# Patient Record
Sex: Female | Born: 1980 | Race: Black or African American | Hispanic: No | Marital: Married | Smoking: Never smoker
Health system: Southern US, Community
[De-identification: ages and names within clinical notes are randomized; demographics above are authoritative.]

## PROBLEM LIST (undated history)

## (undated) ENCOUNTER — Inpatient Hospital Stay (HOSPITAL_COMMUNITY): Payer: Self-pay

## (undated) DIAGNOSIS — R110 Nausea: Secondary | ICD-10-CM

## (undated) DIAGNOSIS — Z3490 Encounter for supervision of normal pregnancy, unspecified, unspecified trimester: Secondary | ICD-10-CM

## (undated) DIAGNOSIS — Z789 Other specified health status: Secondary | ICD-10-CM

## (undated) HISTORY — DX: Nausea: R11.0

## (undated) HISTORY — DX: Other specified health status: Z78.9

## (undated) HISTORY — DX: Encounter for supervision of normal pregnancy, unspecified, unspecified trimester: Z34.90

---

## 2000-08-28 ENCOUNTER — Emergency Department (HOSPITAL_COMMUNITY): Admission: EM | Admit: 2000-08-28 | Discharge: 2000-08-28 | Payer: Self-pay | Admitting: Emergency Medicine

## 2001-05-26 ENCOUNTER — Other Ambulatory Visit: Admission: RE | Admit: 2001-05-26 | Discharge: 2001-05-26 | Payer: Self-pay | Admitting: Obstetrics and Gynecology

## 2001-08-05 ENCOUNTER — Emergency Department (HOSPITAL_COMMUNITY): Admission: EM | Admit: 2001-08-05 | Discharge: 2001-08-05 | Payer: Self-pay | Admitting: Emergency Medicine

## 2001-09-04 ENCOUNTER — Emergency Department (HOSPITAL_COMMUNITY): Admission: EM | Admit: 2001-09-04 | Discharge: 2001-09-04 | Payer: Self-pay | Admitting: *Deleted

## 2002-04-02 ENCOUNTER — Emergency Department (HOSPITAL_COMMUNITY): Admission: EM | Admit: 2002-04-02 | Discharge: 2002-04-02 | Payer: Self-pay | Admitting: Emergency Medicine

## 2002-06-06 ENCOUNTER — Emergency Department (HOSPITAL_COMMUNITY): Admission: EM | Admit: 2002-06-06 | Discharge: 2002-06-06 | Payer: Self-pay | Admitting: *Deleted

## 2003-01-03 ENCOUNTER — Emergency Department (HOSPITAL_COMMUNITY): Admission: EM | Admit: 2003-01-03 | Discharge: 2003-01-03 | Payer: Self-pay | Admitting: Emergency Medicine

## 2003-11-12 ENCOUNTER — Emergency Department (HOSPITAL_COMMUNITY): Admission: EM | Admit: 2003-11-12 | Discharge: 2003-11-12 | Payer: Self-pay | Admitting: Emergency Medicine

## 2004-08-25 ENCOUNTER — Emergency Department (HOSPITAL_COMMUNITY): Admission: EM | Admit: 2004-08-25 | Discharge: 2004-08-25 | Payer: Self-pay | Admitting: Emergency Medicine

## 2004-10-11 ENCOUNTER — Emergency Department (HOSPITAL_COMMUNITY): Admission: EM | Admit: 2004-10-11 | Discharge: 2004-10-11 | Payer: Self-pay | Admitting: Emergency Medicine

## 2005-01-19 ENCOUNTER — Emergency Department (HOSPITAL_COMMUNITY): Admission: EM | Admit: 2005-01-19 | Discharge: 2005-01-19 | Payer: Self-pay | Admitting: Emergency Medicine

## 2005-07-27 ENCOUNTER — Emergency Department (HOSPITAL_COMMUNITY): Admission: EM | Admit: 2005-07-27 | Discharge: 2005-07-27 | Payer: Self-pay | Admitting: Emergency Medicine

## 2005-11-15 ENCOUNTER — Emergency Department (HOSPITAL_COMMUNITY): Admission: EM | Admit: 2005-11-15 | Discharge: 2005-11-15 | Payer: Self-pay | Admitting: Emergency Medicine

## 2006-04-02 ENCOUNTER — Emergency Department (HOSPITAL_COMMUNITY): Admission: EM | Admit: 2006-04-02 | Discharge: 2006-04-02 | Payer: Self-pay | Admitting: Emergency Medicine

## 2006-04-11 ENCOUNTER — Emergency Department (HOSPITAL_COMMUNITY): Admission: EM | Admit: 2006-04-11 | Discharge: 2006-04-11 | Payer: Self-pay | Admitting: Emergency Medicine

## 2006-10-29 ENCOUNTER — Emergency Department (HOSPITAL_COMMUNITY): Admission: EM | Admit: 2006-10-29 | Discharge: 2006-10-29 | Payer: Self-pay | Admitting: *Deleted

## 2007-02-10 ENCOUNTER — Emergency Department (HOSPITAL_COMMUNITY): Admission: EM | Admit: 2007-02-10 | Discharge: 2007-02-10 | Payer: Self-pay | Admitting: Emergency Medicine

## 2007-03-11 ENCOUNTER — Inpatient Hospital Stay (HOSPITAL_COMMUNITY): Admission: AD | Admit: 2007-03-11 | Discharge: 2007-03-13 | Payer: Self-pay | Admitting: Obstetrics & Gynecology

## 2007-03-11 ENCOUNTER — Ambulatory Visit: Payer: Self-pay | Admitting: *Deleted

## 2007-10-06 ENCOUNTER — Emergency Department (HOSPITAL_COMMUNITY): Admission: EM | Admit: 2007-10-06 | Discharge: 2007-10-06 | Payer: Self-pay | Admitting: Emergency Medicine

## 2008-06-30 IMAGING — CR DG FOOT COMPLETE 3+V*R*
3 series · 3 of 3 positions shown · non-contrast
Comparison: none

CLINICAL DATA: 24-year-old with knot on right foot.  Anterolateral knot for several weeks.  No known injury.
 RIGHT FOOT - 3 VIEW:

[view not recorded (1 of 3)]
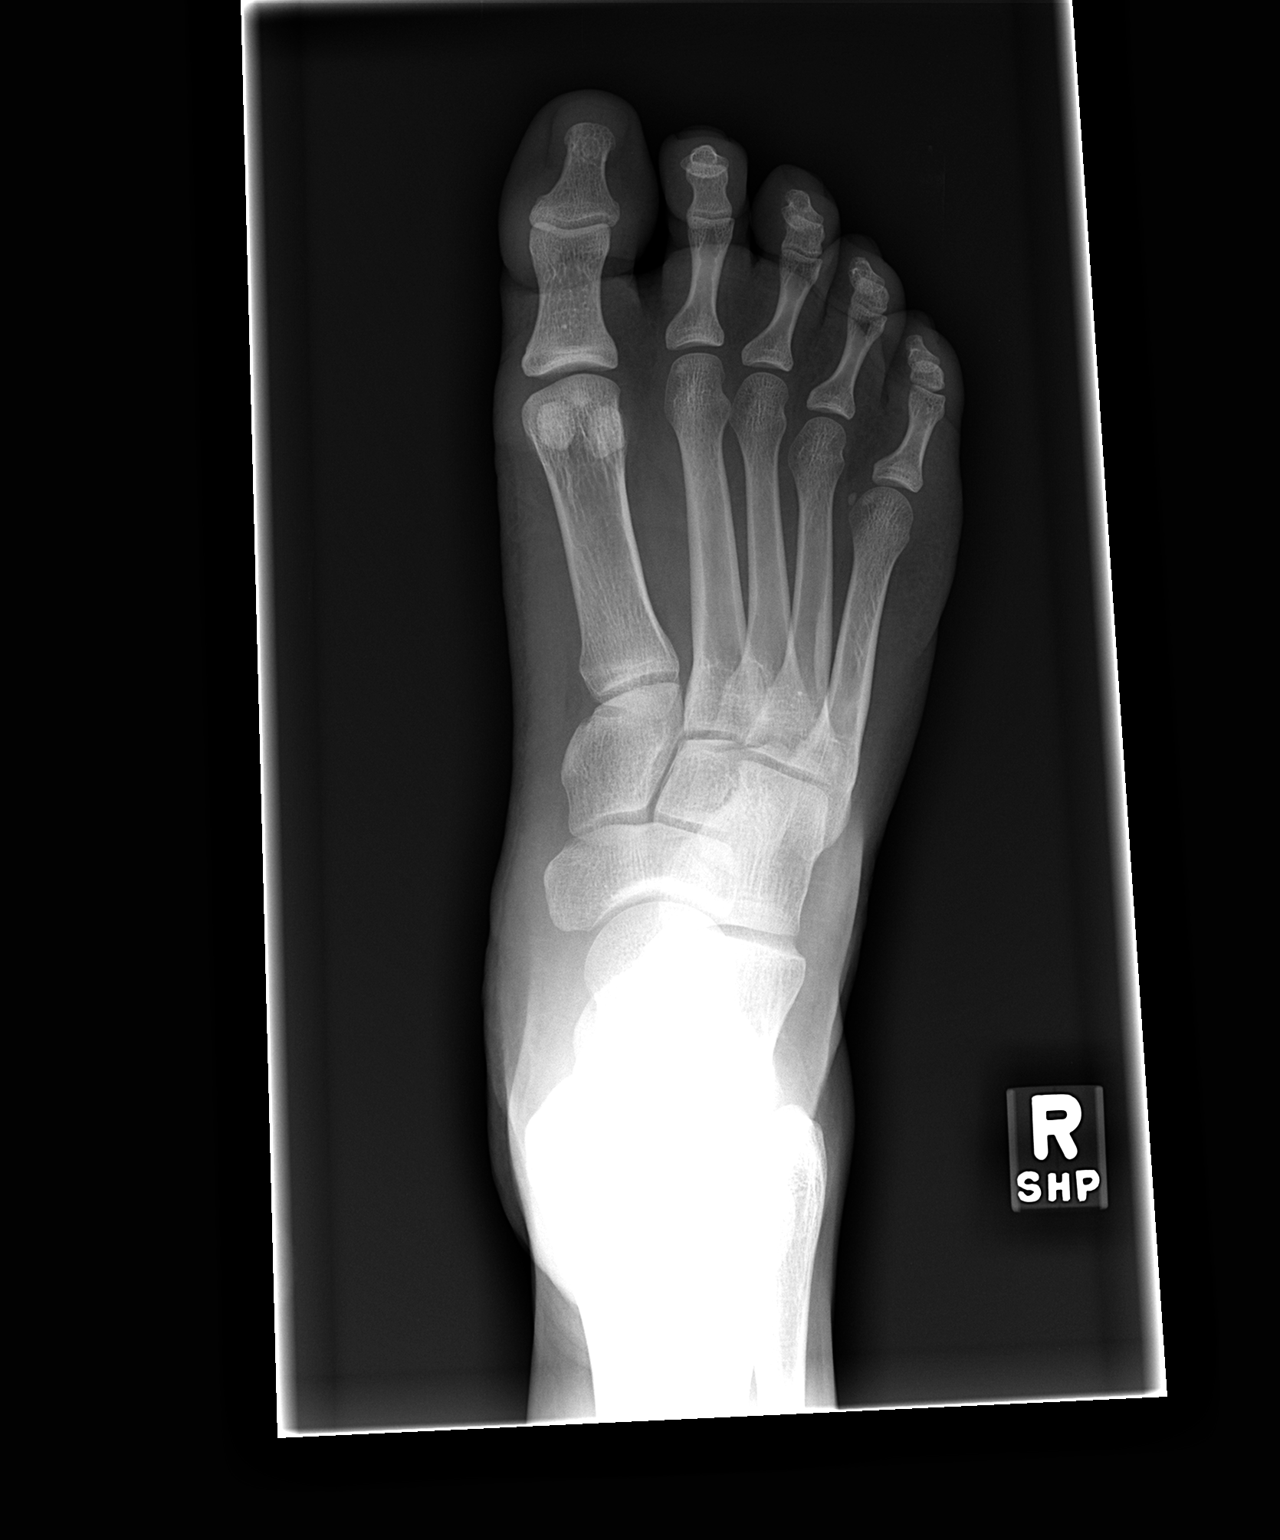

[view not recorded (2 of 3)]
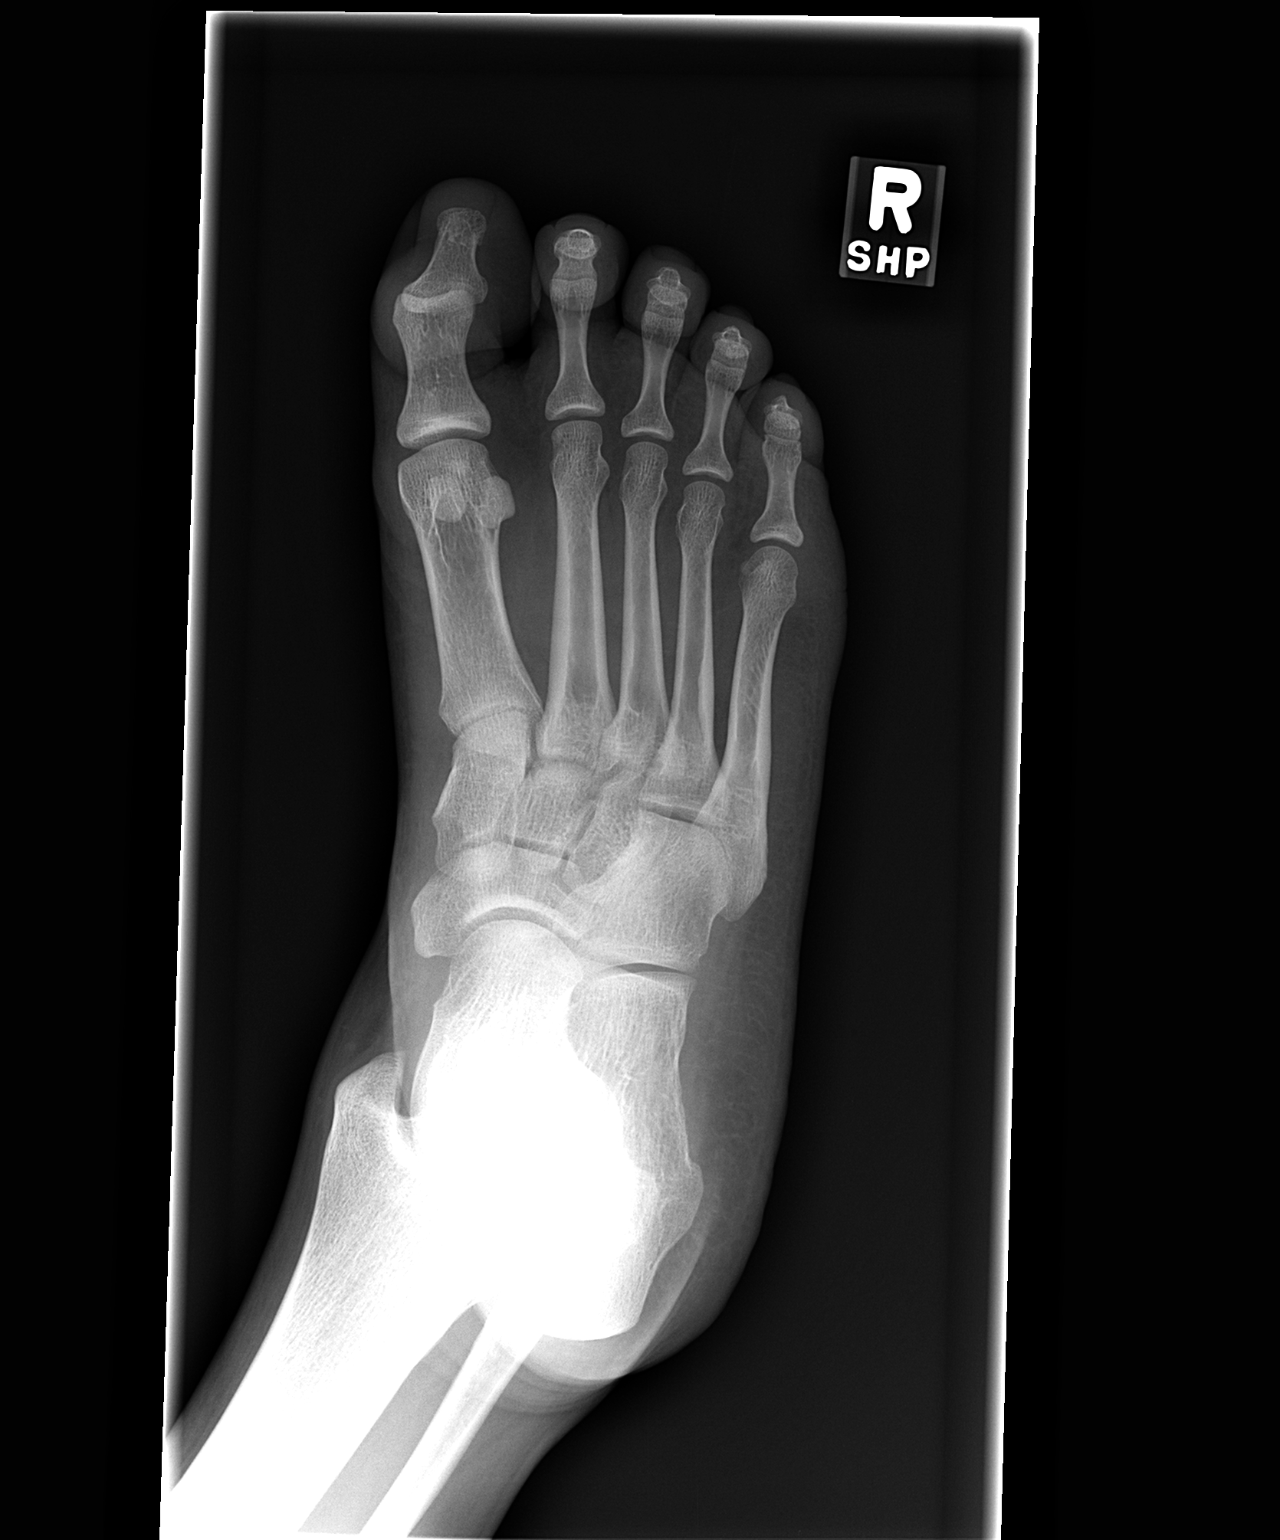

[view not recorded (3 of 3)]
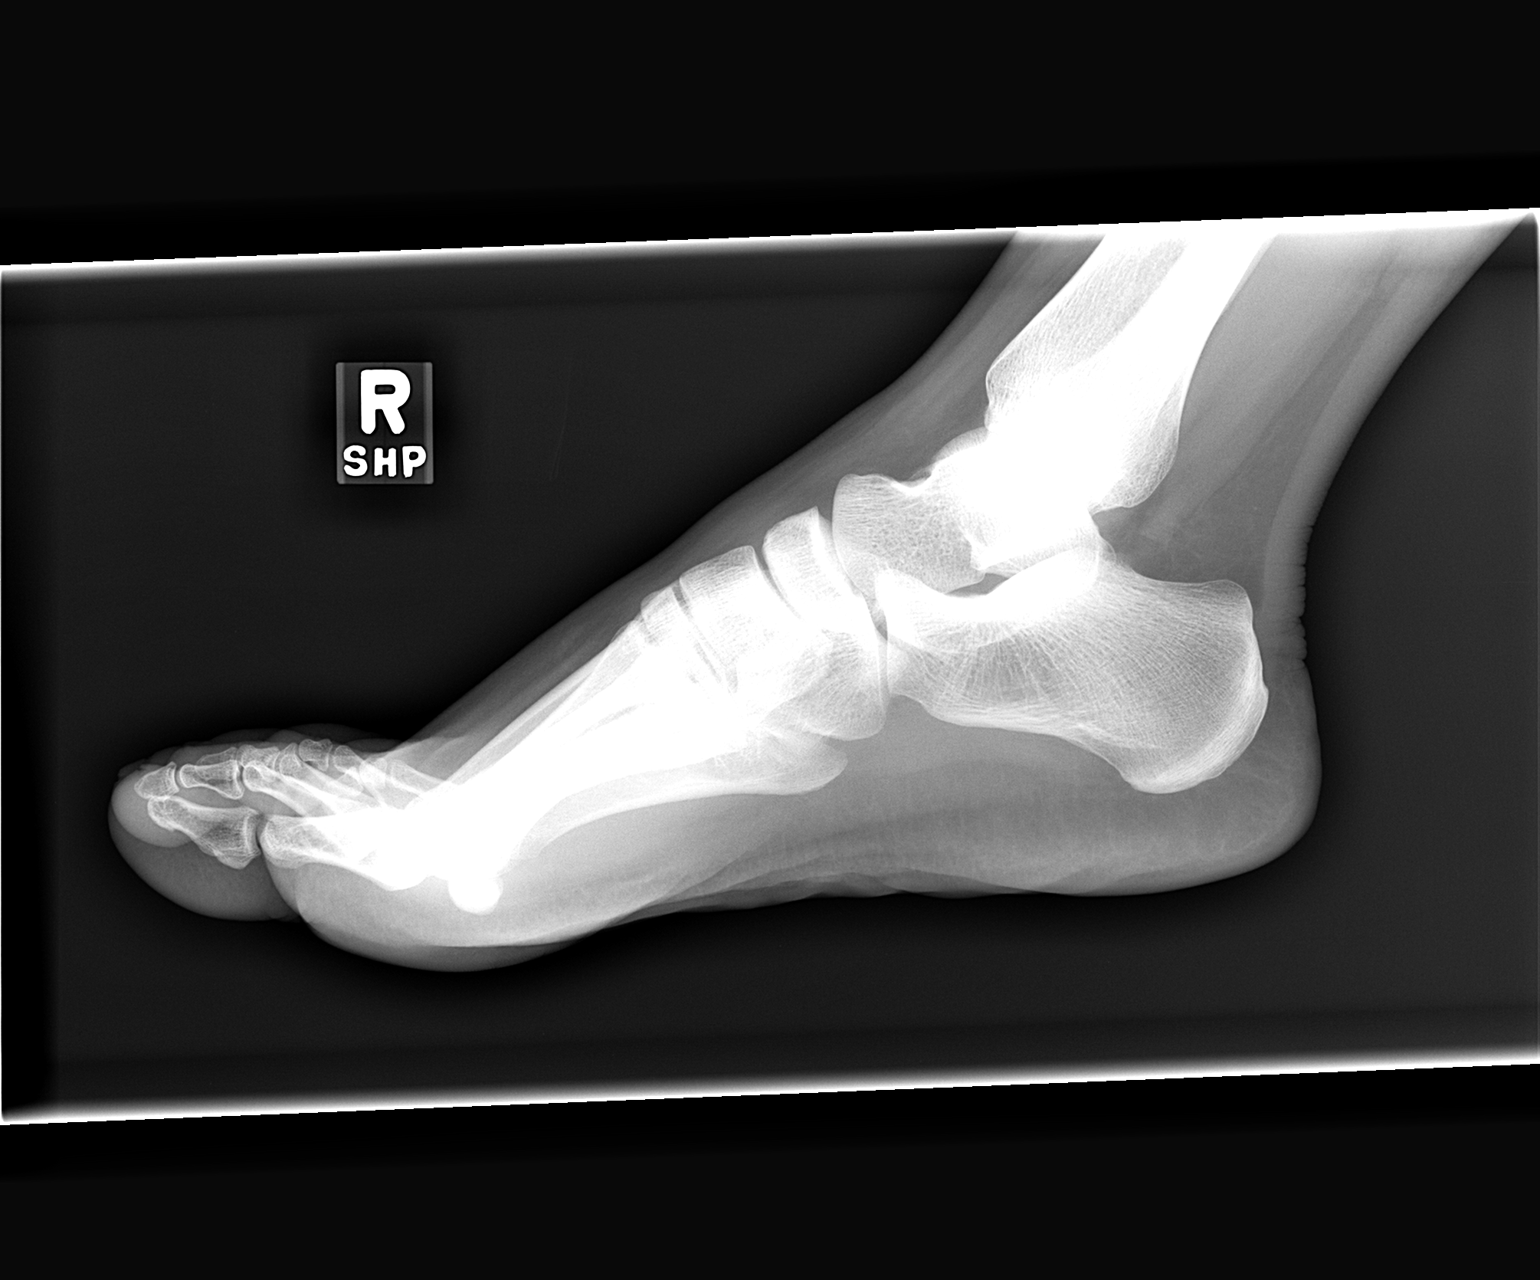

[3 of 3 positions shown; findings below may reference images not displayed]

FINDINGS: There is no evidence of fracture or dislocation.  There is no evidence of arthropathy or other focal bone abnormality.  Soft tissues are unremarkable.
IMPRESSION: Negative.

## 2009-03-28 ENCOUNTER — Emergency Department (HOSPITAL_COMMUNITY): Admission: EM | Admit: 2009-03-28 | Discharge: 2009-03-28 | Payer: Self-pay | Admitting: Emergency Medicine

## 2009-07-14 ENCOUNTER — Emergency Department (HOSPITAL_COMMUNITY): Admission: EM | Admit: 2009-07-14 | Discharge: 2009-07-14 | Payer: Self-pay | Admitting: Emergency Medicine

## 2010-03-03 ENCOUNTER — Emergency Department (HOSPITAL_COMMUNITY): Admission: EM | Admit: 2010-03-03 | Discharge: 2010-03-03 | Payer: Self-pay | Admitting: Emergency Medicine

## 2010-03-25 ENCOUNTER — Emergency Department (HOSPITAL_COMMUNITY)
Admission: EM | Admit: 2010-03-25 | Discharge: 2010-03-25 | Payer: Self-pay | Source: Home / Self Care | Admitting: Emergency Medicine

## 2010-08-07 ENCOUNTER — Emergency Department (HOSPITAL_COMMUNITY)
Admission: EM | Admit: 2010-08-07 | Discharge: 2010-08-07 | Disposition: A | Discharge status: Home / Self Care | Payer: Medicaid Other | Attending: Emergency Medicine | Admitting: Emergency Medicine

## 2010-08-07 DIAGNOSIS — H5789 Other specified disorders of eye and adnexa: Secondary | ICD-10-CM | POA: Insufficient documentation

## 2010-08-07 DIAGNOSIS — J309 Allergic rhinitis, unspecified: Secondary | ICD-10-CM | POA: Insufficient documentation

## 2010-08-07 DIAGNOSIS — J3489 Other specified disorders of nose and nasal sinuses: Secondary | ICD-10-CM | POA: Insufficient documentation

## 2011-01-07 ENCOUNTER — Emergency Department (HOSPITAL_COMMUNITY)
Admission: EM | Admit: 2011-01-07 | Discharge: 2011-01-07 | Disposition: A | Discharge status: Home / Self Care | Payer: Medicaid Other | Attending: Emergency Medicine | Admitting: Emergency Medicine

## 2011-01-07 ENCOUNTER — Emergency Department (HOSPITAL_COMMUNITY): Payer: Medicaid Other

## 2011-01-07 ENCOUNTER — Encounter: Payer: Self-pay | Admitting: Emergency Medicine

## 2011-01-07 DIAGNOSIS — J069 Acute upper respiratory infection, unspecified: Secondary | ICD-10-CM | POA: Insufficient documentation

## 2011-01-07 NOTE — ED Notes (Signed)
Pt c/o cough and chest pain with cough and sore throat x 3 days.

## 2011-01-07 NOTE — ED Provider Notes (Signed)
History     CSN: [id number] Arrival date & time: 01/07/2011  5:01 PM   Chief Complaint  Patient presents with  . Cough  . Sore Throat      HPI Pt was seen at 1720.  Tammy Perry is a 30 y.o. female who presents to the Emergency Department complaining of gradual onset and persistence of intermittent cough, runny/stuffy nose, and sore throat x3 days.  Denies fevers, no rash, no abd pain, no N/V/D, no back pain, no hoarse voice.    PAST MEDICAL HISTORY:  History reviewed. No pertinent past medical history.  PAST SURGICAL HISTORY:  History reviewed. No pertinent past surgical history.  MEDICATIONS:  Previous Medications   No medications on file     ALLERGIES:  Allergies as of 01/07/2011  . (No Known Allergies)     SOCIAL HISTORY: History   Social History  . Marital Status: Single    Spouse Name: N/A    Number of Children: N/A  . Years of Education: N/A   Social History Main Topics  . Smoking status: Never Smoker   . Smokeless tobacco: None  . Alcohol Use: No  . Drug Use: No  . Sexually Active:    Other Topics Concern  . None   Social History Narrative  . None     Review of Systems ROS: Statement: All systems negative except as marked or noted in the HPI; Constitutional: Negative for fever and chills. ; ; Eyes: Negative for eye pain, redness and discharge. ; ; ENMT: Negative for ear pain, hoarseness, sinus pressure; +runny/stuffy nose, sore throat. ; ; Cardiovascular: Negative for chest pain, palpitations, diaphoresis, dyspnea and peripheral edema. ; ; Respiratory: +cough.  Negative for wheezing and stridor. ; ; Gastrointestinal: Negative for nausea, vomiting, diarrhea and abdominal pain, blood in stool, hematemesis, jaundice and rectal bleeding. . ; ; Genitourinary: Negative for dysuria, flank pain and hematuria. ; ; Musculoskeletal: Negative for back pain and neck pain. Negative for swelling and trauma.; ; Skin: Negative for pruritus, rash, abrasions,  blisters, bruising and skin lesion.; ; Neuro: Negative for headache, lightheadedness and neck stiffness. Negative for weakness, altered level of consciousness , altered mental status, extremity weakness, paresthesias, involuntary movement, seizure and syncope.      Physical Exam    BP 139/95  Pulse 94  Temp(Src) 99.8 F (37.7 C) (Oral)  Resp 18  Ht 5\' 6"  (1.676 m)  Wt 192 lb (87.091 kg)  BMI 30.99 kg/m2  SpO2 100%  LMP 12/17/2010  Physical Exam 1725: Physical examination:  Nursing notes reviewed; Vital signs and O2 SAT reviewed;  Constitutional: Well developed, Well nourished, Well hydrated, In no acute distress; Head:  Normocephalic, atraumatic; Eyes: EOMI, PERRL, No scleral icterus; ENMT: TM's clear bilat. +edemetous nasal turbinates bilat with clear rhinorrhea. Mouth and pharynx normal, Mucous membranes moist; Neck: Supple, Full range of motion, No lymphadenopathy; Cardiovascular: Regular rate and rhythm, No murmur, rub, or gallop; Respiratory: Breath sounds clear & equal bilaterally, No rales, rhonchi, wheezes, or rub, Normal respiratory effort/excursion; Chest: Nontender, Movement normal; Abdomen: Soft, Nontender, Nondistended, Normal bowel sounds; Extremities: Pulses normal, No tenderness, No edema, No calf edema or asymmetry.; Neuro: AA&Ox3, Major CN grossly intact.  No gross focal motor or sensory deficits in extremities.; Skin: Color normal, Warm, Dry, no rash, no petechiae.    ED Course  Procedures  MDM MDM Reviewed: nursing note and vitals Interpretation: labs and x-ray   Results for orders placed during the hospital encounter of 01/07/11  RAPID STREP SCREEN      Component Value Range   Streptococcus, Group A Screen (Direct) NEGATIVE  NEGATIVE    Dg Chest 2 View 01/07/2011  *RADIOLOGY REPORT*  Clinical Data: Cough, chest pain, sore throat  CHEST - 2 VIEW  Comparison: None  Findings: Normal heart size, mediastinal contours, and pulmonary vascularity. Mild peribronchial  thickening. No pulmonary infiltrate, pleural effusion, or pneumothorax. Bones unremarkable.  IMPRESSION: Minimal bronchitic changes.  Original Report Authenticated By: Lollie Marrow, M.D.   6:37 PM:  Dx testing d/w pt.  Questions answered.  Verb understanding, agreeable to d/c home with outpt f/u.      Tammy Maret Allison Quarry, DO 01/07/11 2305

## 2011-02-02 LAB — CROSSMATCH: Antibody Screen: NEGATIVE

## 2011-02-02 LAB — CBC
HCT: 19 — ABNORMAL LOW
HCT: 29.2 — ABNORMAL LOW
HCT: 36.3
Hemoglobin: 12
Hemoglobin: 6.4 — CL
MCHC: 32.9
MCV: 71.6 — ABNORMAL LOW
Platelets: 148 — ABNORMAL LOW
Platelets: 149 — ABNORMAL LOW
Platelets: 166
RBC: 2.66 — ABNORMAL LOW
RBC: 2.84 — ABNORMAL LOW
RBC: 4.03
RBC: 5.1
RDW: 14.2
RDW: 14.4
RDW: 14.6
WBC: 9.3

## 2011-02-02 LAB — ABO/RH: ABO/RH(D): O POS

## 2011-02-03 LAB — BASIC METABOLIC PANEL
CO2: 25
Chloride: 104
GFR calc Af Amer: >60
GFR calc non Af Amer: >60
Glucose, Bld: 86
Potassium: 3.6
Sodium: 135

## 2011-02-03 LAB — CBC
HCT: 34.4 — ABNORMAL LOW
Hemoglobin: 11.1 — ABNORMAL LOW
MCHC: 32.3
MCV: 70.4 — ABNORMAL LOW
RBC: 4.89
RDW: 14

## 2011-02-03 LAB — URINALYSIS, ROUTINE W REFLEX MICROSCOPIC
Glucose, UA: NEGATIVE
Hgb urine dipstick: NEGATIVE
Ketones, ur: NEGATIVE
Protein, ur: 30 — AB
Specific Gravity, Urine: >1.03 — ABNORMAL HIGH
pH: 6

## 2011-02-03 LAB — URINE MICROSCOPIC-ADD ON

## 2011-02-09 LAB — CBC
RBC: 4.69
RDW: 14.5 — ABNORMAL HIGH

## 2011-02-09 LAB — DIFFERENTIAL
Basophils Relative: 0
Eosinophils Absolute: 0.1
Monocytes Relative: 13 — ABNORMAL HIGH
Neutro Abs: 4.5

## 2011-04-27 ENCOUNTER — Encounter (HOSPITAL_COMMUNITY): Payer: Self-pay

## 2011-04-27 ENCOUNTER — Emergency Department (HOSPITAL_COMMUNITY)
Admission: EM | Admit: 2011-04-27 | Discharge: 2011-04-27 | Disposition: A | Discharge status: Home / Self Care | Payer: Medicaid Other | Attending: Emergency Medicine | Admitting: Emergency Medicine

## 2011-04-27 DIAGNOSIS — L299 Pruritus, unspecified: Secondary | ICD-10-CM | POA: Insufficient documentation

## 2011-04-27 DIAGNOSIS — L259 Unspecified contact dermatitis, unspecified cause: Secondary | ICD-10-CM | POA: Insufficient documentation

## 2011-04-27 DIAGNOSIS — L309 Dermatitis, unspecified: Secondary | ICD-10-CM

## 2011-04-27 MED ORDER — PREDNISONE 10 MG PO TABS: ORAL_TABLET | ORAL | Status: DC

## 2011-04-27 NOTE — ED Provider Notes (Signed)
History     CSN: [id number]  Arrival date & time 04/27/11  1420   First MD Initiated Contact with Patient 04/27/11 1514      Chief Complaint  Patient presents with  . Rash    (Consider location/radiation/quality/duration/timing/severity/associated sxs/prior treatment) HPI Comments: Patient c/o persistent, itching rash to the back her of her and lower back.  She is unsure if possible exposure to new detergents, lotions or soap.  She denies fever, redness, blisters or recent illness.   Patient is a 31 y.o. female presenting with rash. The history is provided by the patient.  Rash  This is a new problem. The current episode started more than 1 week ago. The problem has not changed since onset.The problem is associated with an unknown factor. There has been no fever. The rash is present on the neck and back. The patient is experiencing no pain. Associated symptoms include itching. Pertinent negatives include no blisters, no pain and no weeping. She has tried nothing for the symptoms. The treatment provided no relief.    History reviewed. No pertinent past medical history.  History reviewed. No pertinent past surgical history.  History reviewed. No pertinent family history.  History  Substance Use Topics  . Smoking status: Never Smoker   . Smokeless tobacco: Not on file  . Alcohol Use: No    OB History    Grav Para Term Preterm Abortions TAB SAB Ect Mult Living                  Review of Systems  Constitutional: Negative for fever.  HENT: Negative for neck pain and neck stiffness.   Musculoskeletal: Negative.   Skin: Positive for itching and rash. Negative for color change and wound.  Neurological: Negative for headaches.  Hematological: Negative for adenopathy.  All other systems reviewed and are negative.    Allergies  Review of patient's allergies indicates no known allergies.  Home Medications   Current Outpatient Rx  Name Route Sig Dispense Refill  .  NORETHIN-ETH ESTRAD-FE BIPHAS 1 MG-10 MCG / 10 MCG PO TABS Oral Take 1 tablet by mouth at bedtime.      . IBUPROFEN 200 MG PO TABS Oral Take 200 mg by mouth as needed. For headache       BP 122/80  Pulse 99  Temp(Src) 98.5 F (36.9 C) (Oral)  Resp 18  Ht 5\' 6"  (1.676 m)  Wt 195 lb 3.2 oz (88.542 kg)  BMI 31.51 kg/m2  SpO2 100%  LMP 04/14/2011  Physical Exam  Nursing note and vitals reviewed. Constitutional: She is oriented to person, place, and time. She appears well-developed and well-nourished. No distress.  HENT:  Head: Normocephalic and atraumatic.  Mouth/Throat: Oropharynx is clear and moist.  Neck: Normal range of motion. Neck supple.  Cardiovascular: Normal rate, regular rhythm and normal heart sounds.   Pulmonary/Chest: Effort normal and breath sounds normal.  Musculoskeletal: She exhibits no edema and no tenderness.  Lymphadenopathy:    She has no cervical adenopathy.  Neurological: She is alert and oriented to person, place, and time.  Skin: Rash noted. No petechiae noted. Rash is maculopapular. Rash is not pustular, not vesicular and not urticarial. No erythema.       Maculopapular rash to the posterior neck and mid lower back.    ED Course  Procedures (including critical care time)  Labs Reviewed - No data to display No results found.      MDM    Maculopapular rash to  the posterior neck and mid lower back.  Will try steroids.  Non-toxic appearing.  Areas appear excoriated.          Macky Galik L. Babacar [place], [place] 04/29/11 1609

## 2011-04-27 NOTE — ED Notes (Signed)
C/o rash to back and legs x 1 week

## 2011-04-30 NOTE — ED Provider Notes (Signed)
Medical screening examination/treatment/procedure(s) were performed by non-physician practitioner and as supervising physician I was immediately available for consultation/collaboration.  Nicoletta Dress. Colon Branch, MD 04/30/11 1524

## 2011-05-14 ENCOUNTER — Emergency Department (HOSPITAL_COMMUNITY)
Admission: EM | Admit: 2011-05-14 | Discharge: 2011-05-14 | Disposition: A | Discharge status: Home / Self Care | Payer: Medicaid Other | Attending: Emergency Medicine | Admitting: Emergency Medicine

## 2011-05-14 ENCOUNTER — Encounter (HOSPITAL_COMMUNITY): Payer: Self-pay | Admitting: Emergency Medicine

## 2011-05-14 DIAGNOSIS — R21 Rash and other nonspecific skin eruption: Secondary | ICD-10-CM | POA: Insufficient documentation

## 2011-05-14 DIAGNOSIS — L309 Dermatitis, unspecified: Secondary | ICD-10-CM

## 2011-05-14 DIAGNOSIS — L2989 Other pruritus: Secondary | ICD-10-CM | POA: Insufficient documentation

## 2011-05-14 DIAGNOSIS — L298 Other pruritus: Secondary | ICD-10-CM | POA: Insufficient documentation

## 2011-05-14 DIAGNOSIS — L259 Unspecified contact dermatitis, unspecified cause: Secondary | ICD-10-CM | POA: Insufficient documentation

## 2011-05-14 MED ORDER — DEXAMETHASONE SODIUM PHOSPHATE 4 MG/ML IJ SOLN
10.0000 mg | Freq: Once | INTRAMUSCULAR | Status: AC
  Administered 2011-05-14: 10 mg via INTRAMUSCULAR
  Filled 2011-05-14: qty 3

## 2011-05-14 MED ORDER — PREDNISONE 20 MG PO TABS: ORAL_TABLET | ORAL | Status: DC

## 2011-05-14 MED ORDER — HYDROXYZINE HCL 25 MG PO TABS: 25.0000 mg | ORAL_TABLET | Freq: Four times a day (QID) | ORAL | Status: DC

## 2011-05-14 MED ORDER — HYDROXYZINE HCL 25 MG PO TABS: 25.0000 mg | ORAL_TABLET | Freq: Four times a day (QID) | ORAL | Status: AC

## 2011-05-14 NOTE — ED Provider Notes (Signed)
History     CSN: [id number]  Arrival date & time 05/14/11  0946   First MD Initiated Contact with Patient 05/14/11 1013      Chief Complaint  Patient presents with  . Rash    (Consider location/radiation/quality/duration/timing/severity/associated sxs/prior treatment) HPI Comments: Patient returns to Ed with c/o recurrent rash.  She was seen here 2 weeks ago and treated for same.  States that she was given steroids and sx's resolved, but returned after she finished the medication.  Also c/o itching.  She denies fever, recent illness or swelling.  She has not followed up with anyone since the initial ed visit.    Patient is a 31 y.o. female presenting with rash. The history is provided by the patient. No language interpreter was used.  Rash  This is a recurrent problem. The current episode started more than 1 week ago. The problem has been gradually improving. The problem is associated with an unknown factor. There has been no fever. The rash is present on the neck, back, right lower leg and left lower leg. The patient is experiencing no pain. Associated symptoms include itching. Pertinent negatives include no blisters, no pain and no weeping. She has tried steriods for the symptoms. The treatment provided moderate relief.    History reviewed. No pertinent past medical history.  History reviewed. No pertinent past surgical history.  History reviewed. No pertinent family history.  History  Substance Use Topics  . Smoking status: Never Smoker   . Smokeless tobacco: Not on file  . Alcohol Use: No    OB History    Grav Para Term Preterm Abortions TAB SAB Ect Mult Living                  Review of Systems  Constitutional: Negative for fever and chills.  HENT: Negative for facial swelling and neck pain.   Musculoskeletal: Negative for back pain, joint swelling and arthralgias.  Skin: Positive for itching and rash. Negative for color change and wound.  Neurological: Negative for  dizziness, weakness and headaches.  Hematological: Negative for adenopathy.  All other systems reviewed and are negative.    Allergies  Review of patient's allergies indicates no known allergies.  Home Medications   Current Outpatient Rx  Name Route Sig Dispense Refill  . IBUPROFEN 200 MG PO TABS Oral Take 200 mg by mouth as needed. For headache     . NORETHIN-ETH ESTRAD-FE BIPHAS 1 MG-10 MCG / 10 MCG PO TABS Oral Take 1 tablet by mouth at bedtime.      Marland Kitchen PREDNISONE 10 MG PO TABS  Take 6 tablets day one, 5 tablets day two, 4 tablets day three, 3 tablets day four, 2 tablets day five, then 1 tablet day six 21 tablet 0    BP 121/81  Pulse 78  Temp(Src) 98.8 F (37.1 C) (Oral)  Resp 14  Ht 5\' 6"  (1.676 m)  Wt 165 lb (74.844 kg)  BMI 26.63 kg/m2  SpO2 98%  LMP 05/07/2011  Physical Exam  Nursing note and vitals reviewed. Constitutional: She is oriented to person, place, and time. She appears well-developed and well-nourished. No distress.  HENT:  Head: Normocephalic and atraumatic.  Eyes: EOM are normal. Pupils are equal, round, and reactive to light.  Neck: Normal range of motion. Neck supple. No thyromegaly present.  Cardiovascular: Normal rate, regular rhythm and normal heart sounds.   No murmur heard. Pulmonary/Chest: Effort normal and breath sounds normal.  Musculoskeletal: She exhibits no edema  and no tenderness.  Lymphadenopathy:    She has no cervical adenopathy.  Neurological: She is alert and oriented to person, place, and time.  Skin: Skin is warm and dry. Rash noted. No petechiae noted. Rash is maculopapular. Rash is not pustular, not vesicular and not urticarial. No erythema.       Papular rash to the posterior neck ,  Lower back and lower extremities.      ED Course  Procedures (including critical care time)      MDM     Recurrent rash to the lower back, posterior neck and lower extremities.  Will repeat a course of steroids.  She agrees to f/u with  dermatology.  Non-toxic appearing.  Medical screening examination/treatment/procedure(s) were performed by non-physician practitioner and as supervising physician I was immediately available for consultation/collaboration. Osvaldo Human, M.D.       Tammy L. [place], [place] 05/16/11 2307  Carleene Cooper III, MD 05/19/11 [phone]

## 2011-05-14 NOTE — ED Notes (Signed)
Pt to ED with c/o rash on neck and BLE. Stated she was seen in ED on 04/27/11 for same complaints and given Prednisone. Rash did clear up with the medication but the itching started again 1 week ago.

## 2011-05-28 ENCOUNTER — Emergency Department (HOSPITAL_COMMUNITY)
Admission: EM | Admit: 2011-05-28 | Discharge: 2011-05-28 | Disposition: A | Discharge status: Home / Self Care | Payer: Medicaid Other | Attending: Emergency Medicine | Admitting: Emergency Medicine

## 2011-05-28 ENCOUNTER — Encounter (HOSPITAL_COMMUNITY): Payer: Self-pay

## 2011-05-28 DIAGNOSIS — L309 Dermatitis, unspecified: Secondary | ICD-10-CM

## 2011-05-28 DIAGNOSIS — L988 Other specified disorders of the skin and subcutaneous tissue: Secondary | ICD-10-CM | POA: Insufficient documentation

## 2011-05-28 DIAGNOSIS — L851 Acquired keratosis [keratoderma] palmaris et plantaris: Secondary | ICD-10-CM | POA: Insufficient documentation

## 2011-05-28 MED ORDER — MOMETASONE FUROATE 0.1 % EX CREA: TOPICAL_CREAM | CUTANEOUS | Status: DC

## 2011-05-28 MED ORDER — HYDROXYZINE HCL 25 MG PO TABS
25.0000 mg | ORAL_TABLET | Freq: Once | ORAL | Status: AC
  Administered 2011-05-28: 25 mg via ORAL
  Filled 2011-05-28: qty 1

## 2011-05-28 MED ORDER — HYDROXYZINE HCL 25 MG PO TABS
ORAL_TABLET | ORAL | Status: AC
  Filled 2011-05-28: qty 1

## 2011-05-28 NOTE — ED Provider Notes (Signed)
History     CSN: [id number]  Arrival date & time 05/28/11  1506   First MD Initiated Contact with Patient 05/28/11 1530      Chief Complaint  Patient presents with  . Rash    (Consider location/radiation/quality/duration/timing/severity/associated sxs/prior treatment) HPI Comments: Patient comes to the emergency department complaining of a rash for one week to her upper buttocks and neck.  This is the patient's third visit to the emergency department with this complaint. She was treated on previous visits with steroids which improves the rash, but the rash returns when the medication is finished. She also complains of itching today.  She denies use of new medications, lotions, or soaps.  She was referred to a dermatologist on a previous visit but states she was unable to see the dermatologist because she was told they do not accept Medicaid patients.  She states she has an appointment with the health department in 2 weeks.  She denies redness, drainage, or swelling.  Patient is a 31 y.o. female presenting with rash. The history is provided by the patient. No language interpreter was used.  Rash  This is a chronic problem. The current episode started more than 1 week ago. Progression since onset: Waxing and waning. The problem is associated with an unknown factor. There has been no fever. The rash is present on the neck (buttocks). The patient is experiencing no pain. Associated symptoms include itching. Pertinent negatives include no blisters, no pain and no weeping. She has tried steriods for the symptoms. The treatment provided moderate relief.    History reviewed. No pertinent past medical history.  History reviewed. No pertinent past surgical history.  History reviewed. No pertinent family history.  History  Substance Use Topics  . Smoking status: Never Smoker   . Smokeless tobacco: Not on file  . Alcohol Use: No    OB History    Grav Para Term Preterm Abortions TAB SAB Ect  Mult Living                  Review of Systems  Constitutional: Negative for fever, activity change and appetite change.  HENT: Negative for facial swelling, neck pain and neck stiffness.   Skin: Positive for itching and rash. Negative for color change and pallor.  Neurological: Negative for dizziness, weakness and numbness.  All other systems reviewed and are negative.    Allergies  Review of patient's allergies indicates no known allergies.  Home Medications   Current Outpatient Rx  Name Route Sig Dispense Refill  . HYDROCORTISONE 20 MG PO TABS Oral Take 20 mg by mouth daily.    . IBUPROFEN 200 MG PO TABS Oral Take 200 mg by mouth as needed. For headache     . NORETHIN-ETH ESTRAD-FE BIPHAS 1 MG-10 MCG / 10 MCG PO TABS Oral Take 1 tablet by mouth at bedtime.        BP 141/82  Pulse 97  Temp(Src) 98.4 F (36.9 C) (Oral)  Resp 20  Ht 5\' 6"  (1.676 m)  Wt 196 lb 1 oz (88.933 kg)  BMI 31.65 kg/m2  SpO2 100%  LMP 05/07/2011  Physical Exam  Nursing note and vitals reviewed. Constitutional: She is oriented to person, place, and time. She appears well-developed and well-nourished. No distress.  HENT:  Head: Normocephalic and atraumatic.  Neck: Normal range of motion. Neck supple.  Cardiovascular: Normal rate, regular rhythm and normal heart sounds.   Pulmonary/Chest: Effort normal and breath sounds normal. No respiratory distress.  Musculoskeletal: Normal range of motion. She exhibits no edema and no tenderness.  Lymphadenopathy:    She has no cervical adenopathy.  Neurological: She is alert and oriented to person, place, and time. She exhibits normal muscle tone. Coordination normal.  Skin: Skin is warm and dry. Rash noted.       Hyperkeratotic appearing lesion to posterior right neck and upper gluteal folds.  Several small fissures of the skin and areas of excoriation.  No bleeding or drainage.    ED Course  Procedures (including critical care time)       MDM     Patient is alert and oriented. Is nontoxic appearing. Is a small hyperkeratotic area to the posterior neck and upper portion of the gluteal fold.  No drainage, bleeding or surrounding erythema.  Patient also seen by EDP.  Likely eczema.  Pt has appt in 2 weeks at the health dept.    Patient / Family / Caregiver understand and agree with initial ED impression and plan with expectations set for ED visit. Pt stable in ED with no significant deterioration in condition.         Birgitta Uhlir L. [place], [place] 05/28/11 1733

## 2011-05-28 NOTE — ED Notes (Signed)
C/o rash for 1 week. Seen recently for same given oral steroids and rash cleared. Rash back after medication completed.

## 2011-05-28 NOTE — ED Notes (Signed)
Pt a/ox4. Resp even and unlabored. NAD at this time. D/C instructions reviewed with pt. Pt verbalized understanding. Pt ambulated to lobby with steady gate.  

## 2011-05-28 NOTE — Progress Notes (Signed)
Examined patient with mid level. Patient with hyperkeratotic lesion to the right side of her posterior neck and a large similar lesion to the gluteal fold and sacrum c/w eczema with excoriation. Recommended use of Elecon  In addition to an emmollient cream (Eucerin, Aveeno, [facility] brand) and use of Metholatum for excoriation.

## 2011-05-28 NOTE — ED Provider Notes (Signed)
See progress note for my assessment of patient.    Medical screening examination/treatment/procedure(s) were conducted as a shared visit with non-physician practitioner(s) and myself.  I personally evaluated the patient during the encounter  Nicoletta Dress. Colon Branch, MD 05/28/11 1736

## 2012-10-03 ENCOUNTER — Emergency Department (HOSPITAL_COMMUNITY)
Admission: EM | Admit: 2012-10-03 | Discharge: 2012-10-03 | Disposition: A | Discharge status: Home / Self Care | Payer: Medicaid Other | Attending: Emergency Medicine | Admitting: Emergency Medicine

## 2012-10-03 ENCOUNTER — Encounter (HOSPITAL_COMMUNITY): Payer: Self-pay

## 2012-10-03 DIAGNOSIS — IMO0002 Reserved for concepts with insufficient information to code with codable children: Secondary | ICD-10-CM | POA: Insufficient documentation

## 2012-10-03 DIAGNOSIS — M7989 Other specified soft tissue disorders: Secondary | ICD-10-CM | POA: Insufficient documentation

## 2012-10-03 DIAGNOSIS — Z79899 Other long term (current) drug therapy: Secondary | ICD-10-CM | POA: Insufficient documentation

## 2012-10-03 DIAGNOSIS — Y929 Unspecified place or not applicable: Secondary | ICD-10-CM | POA: Insufficient documentation

## 2012-10-03 DIAGNOSIS — L089 Local infection of the skin and subcutaneous tissue, unspecified: Secondary | ICD-10-CM | POA: Insufficient documentation

## 2012-10-03 DIAGNOSIS — Y9389 Activity, other specified: Secondary | ICD-10-CM | POA: Insufficient documentation

## 2012-10-03 DIAGNOSIS — X58XXXA Exposure to other specified factors, initial encounter: Secondary | ICD-10-CM | POA: Insufficient documentation

## 2012-10-03 MED ORDER — MUPIROCIN CALCIUM 2 % EX CREA: TOPICAL_CREAM | Freq: Three times a day (TID) | CUTANEOUS | Status: DC

## 2012-10-03 NOTE — ED Provider Notes (Signed)
History     CSN: [id number]  Arrival date & time 10/03/12  2055   First MD Initiated Contact with Patient 10/03/12 2211      Chief Complaint  Patient presents with  . Insect Bite  . Skin Discoloration    (Consider location/radiation/quality/duration/timing/severity/associated sxs/prior treatment) HPI Comments: Pt sustained an insect bite to the right 3rd toes 3 days ago. She scratched the area and now has a discolored area of the 3rd toe. She c/o only minimal pain. Some swelling noted. Minimal drainage present. She has cleansed the area with soap and water. No other treatment reported. Not a smoker. No hx of diabetes.  No immune compromising conditions reported.  The history is provided by the patient.    History reviewed. No pertinent past medical history.  No past surgical history on file.  No family history on file.  History  Substance Use Topics  . Smoking status: Never Smoker   . Smokeless tobacco: Not on file  . Alcohol Use: No    OB History   Grav Para Term Preterm Abortions TAB SAB Ect Mult Living                  Review of Systems  Constitutional: Negative for activity change.       All ROS Neg except as noted in HPI  HENT: Negative for nosebleeds and neck pain.   Eyes: Negative for photophobia and discharge.  Respiratory: Negative for cough, shortness of breath and wheezing.   Cardiovascular: Negative for chest pain and palpitations.  Gastrointestinal: Negative for abdominal pain and blood in stool.  Genitourinary: Negative for dysuria, frequency and hematuria.  Musculoskeletal: Negative for back pain and arthralgias.  Skin: Negative.   Neurological: Negative for dizziness, seizures and speech difficulty.  Psychiatric/Behavioral: Negative for hallucinations and confusion.    Allergies  Review of patient's allergies indicates no known allergies.  Home Medications   Current Outpatient Rx  Name  Route  Sig  Dispense  Refill  . hydrocortisone  (CORTEF) 20 MG tablet   Oral   Take 20 mg by mouth daily.         Marland Kitchen ibuprofen (ADVIL,MOTRIN) 200 MG tablet   Oral   Take 200 mg by mouth as needed. For headache          . mometasone (ELOCON) 0.1 % cream      Apply to the affected areas BID   45 g   0   . Norethindrone-Ethinyl Estradiol-Fe Biphas (LO LOESTRIN FE) 1 MG-10 MCG / 10 MCG tablet   Oral   Take 1 tablet by mouth at bedtime.             BP 131/89  Pulse 88  Temp(Src) 98.2 F (36.8 C) (Oral)  Resp 18  Ht 5\' 6"  (1.676 m)  Wt 185 lb (83.915 kg)  BMI 29.87 kg/m2  SpO2 100%  LMP 09/23/2012  Physical Exam  Nursing note and vitals reviewed. Constitutional: She is oriented to person, place, and time. She appears well-developed and well-nourished.  Non-toxic appearance.  HENT:  Head: Normocephalic.  Right Ear: Tympanic membrane and external ear normal.  Left Ear: Tympanic membrane and external ear normal.  Eyes: EOM and lids are normal. Pupils are equal, round, and reactive to light.  Neck: Normal range of motion. Neck supple. Carotid bruit is not present.  Cardiovascular: Normal rate, regular rhythm, normal heart sounds, intact distal pulses and normal pulses.   Pulmonary/Chest: Breath sounds normal. No respiratory distress.  Abdominal: Soft. Bowel sounds are normal. There is no tenderness. There is no guarding.  Musculoskeletal: Normal range of motion.       Feet:  Lymphadenopathy:       Head (right side): No submandibular adenopathy present.       Head (left side): No submandibular adenopathy present.    She has no cervical adenopathy.  Neurological: She is alert and oriented to person, place, and time. She has normal strength. No cranial nerve deficit or sensory deficit.  Skin: Skin is warm and dry.  Psychiatric: She has a normal mood and affect. Her speech is normal.    ED Course  Procedures (including critical care time)  Labs Reviewed - No data to display No results found.   No diagnosis  found.    MDM  I have reviewed nursing notes, vital signs, and all appropriate lab and imaging results for this patient. Pt has a dark scabbed area of the right 3rd toe. No red streaking noted. No lesions between the toes. Plan continue to cleanse the wound with soap and water. Use bactroban tid. Pt to see her primary MD or return to the Emergency Dept if not improving.       Kathie Dike, PA-C 10/06/12 [phone]

## 2012-10-03 NOTE — ED Notes (Signed)
Pt has discoloration to rt 3rd toe. Says it started as an insect bite that itched. Now is dark and color  And cont to itch.

## 2012-10-03 NOTE — ED Notes (Signed)
I had an insect bite on my right 3rd toe and it itches a lot and now the skin is discolored.

## 2012-10-10 NOTE — ED Provider Notes (Signed)
Medical screening examination/treatment/procedure(s) were performed by non-physician practitioner and as supervising physician I was immediately available for consultation/collaboration.  Chanika Byland, MD 10/10/12 0832 

## 2012-10-19 ENCOUNTER — Encounter (HOSPITAL_COMMUNITY): Payer: Self-pay | Admitting: *Deleted

## 2012-10-19 ENCOUNTER — Emergency Department (HOSPITAL_COMMUNITY)
Admission: EM | Admit: 2012-10-19 | Discharge: 2012-10-19 | Disposition: A | Discharge status: Home / Self Care | Payer: Medicaid Other | Attending: Emergency Medicine | Admitting: Emergency Medicine

## 2012-10-19 DIAGNOSIS — L255 Unspecified contact dermatitis due to plants, except food: Secondary | ICD-10-CM | POA: Insufficient documentation

## 2012-10-19 DIAGNOSIS — B353 Tinea pedis: Secondary | ICD-10-CM | POA: Insufficient documentation

## 2012-10-19 MED ORDER — CLOTRIMAZOLE 1 % EX CREA: TOPICAL_CREAM | CUTANEOUS | Status: DC

## 2012-10-19 MED ORDER — PREDNISONE 10 MG PO TABS: ORAL_TABLET | ORAL | Status: DC

## 2012-10-19 MED ORDER — HYDROXYZINE HCL 25 MG PO TABS: 25.0000 mg | ORAL_TABLET | Freq: Four times a day (QID) | ORAL | Status: DC | PRN

## 2012-10-19 NOTE — ED Notes (Signed)
Rash to right foot x 2 months - cream rx given has not been working.  Rash to bil arms x 1 1/2 wks with itching.  Denies pain.

## 2012-10-19 NOTE — ED Provider Notes (Signed)
History    CSN: [id number] Arrival date & time 10/19/12  1459  First MD Initiated Contact with Patient 10/19/12 1604     Chief Complaint  Patient presents with  . Rash   (Consider location/radiation/quality/duration/timing/severity/associated sxs/prior Treatment) HPI Comments: Tammy Perry is a 32 y.o. female who presents to the Emergency Department complaining of persistent rash to her right third toe for 3 weeks.  She states she was seen here previously and prescribed Bactroban cream that she uses 3 times a day and the rash is not improving.  She also reports having a similar area on her right hand as well.  She report scaling and itching to the area.  She also reports a rash to her bilateral arms and upper back for one week.  States this rash is new and does not think its related to the rash on her foot.  She denies hx of diabetes, fever, chills, drainage or swelling.    The history is provided by the patient.   History reviewed. No pertinent past medical history. History reviewed. No pertinent past surgical history. No family history on file. History  Substance Use Topics  . Smoking status: Never Smoker   . Smokeless tobacco: Not on file  . Alcohol Use: No   OB History   Grav Para Term Preterm Abortions TAB SAB Ect Mult Living                 Review of Systems  Constitutional: Negative for fever, chills, activity change and appetite change.  HENT: Negative for sore throat, facial swelling, trouble swallowing, neck pain and neck stiffness.   Respiratory: Negative for chest tightness, shortness of breath and wheezing.   Skin: Positive for rash. Negative for wound.  Neurological: Negative for dizziness, weakness, numbness and headaches.  All other systems reviewed and are negative.    Allergies  Review of patient's allergies indicates no known allergies.  Home Medications   Current Outpatient Rx  Name  Route  Sig  Dispense  Refill  . mupirocin ointment  (BACTROBAN) 2 %   Topical   Apply 1 application topically 3 (three) times daily.          BP 136/86  Pulse 78  Temp(Src) 97.8 F (36.6 C) (Oral)  Resp 16  Ht 5\' 6"  (1.676 m)  Wt 185 lb (83.915 kg)  BMI 29.87 kg/m2  SpO2 100%  LMP 09/23/2012 Physical Exam  Nursing note and vitals reviewed. Constitutional: She is oriented to person, place, and time. She appears well-developed and well-nourished. No distress.  HENT:  Head: Normocephalic and atraumatic.  Mouth/Throat: Oropharynx is clear and moist.  Neck: Normal range of motion. Neck supple.  Cardiovascular: Normal rate, regular rhythm and normal heart sounds.   Pulmonary/Chest: Effort normal and breath sounds normal.  Musculoskeletal: She exhibits no edema and no tenderness.  Lymphadenopathy:    She has no cervical adenopathy.  Neurological: She is alert and oriented to person, place, and time. She exhibits normal muscle tone. Coordination normal.  Skin: Skin is warm and dry. Rash noted.  hyperkeratotic , scaly plaque to the dorsal left third toe and over the fourth MC head.  Scattered pin point erythematous papules to the bilateral forearms that appear in a linear pattern.  No vesicles or petechia.    ED Course  Procedures (including critical care time) Labs Reviewed - No data to display   MDM   hyperkeratotic , scaly plaque to the dorsal left third toe.  No  drainage, erythema or edema. Similar appearing lesion also present on the dorsal right hand.  Appears c/w tinea. Rash to UE's appears c/w plant dermatitis.   I will have patient d/c the Bactroban and prescribe clotrimazole, prednisone taper, and give referral for dermatology.  VSS.  Appears stable for discharge.  Rosali Augello L. Trisha Mangle, PA-C 10/19/12 2307

## 2012-10-19 NOTE — Discharge Instructions (Signed)
Fungus Infection of the Skin An infection of your skin caused by a fungus is a very common problem. Treatment depends on which part of the body is affected. Types of fungal skin infection include:  Athlete's Foot(Tinea pedis). This infection starts between the toes and may involve the entire sole and sides of foot. It is the most common fungal disease. It is made worse by heat, moisture, and friction. To treat, wash your feet 2 to 3 times daily. Dry thoroughly between the toes. Use medicated foot powder or cream as directed on the package. Plain talc, cornstarch, or rice powder may be dusted into socks and shoes to keep the feet dry. Wearing footwear that allows ventilation is also helpful.  Ringworm (Tinea corporis and tinea capitis). This infection causes scaly red rings to form on the skin or scalp. For skin sores, apply medicated lotion or cream as directed on the package. For the scalp, medicated shampoo may be used with with other therapies. Ringworm of the scalp or fingernails usually requires using oral medicine for 2 to 4 months.  Tinea versicolor. This infection appears as painless, scaly, patchy areas of discolored skin (whitish to light brown). It is more common in the summer and favors oily areas of the skin such as those found at the chest, abdomen, back, pubis, neck, and body folds. It can be treated with medicated shampoo or with medicated topical cream. Oral antifungals may be needed for more active infections. The light and/or dark spots may take time to get better and is not a sign of treatment failure. Fungal infections may need to be treated for several weeks to be cured. It is important not to treat fungal infections with steroids or combination medicine that contains an antifungal and steroid as these will make the fungal infection worse. SEEK MEDICAL CARE IF:   You have persistent itching or rawness.  You have an oral temperature above 102 F (38.9 C). Document Released:  05/20/2004 Document Revised: 07/05/2011 Document Reviewed: 08/05/2009 [facility] Patient Information 2013 [place], [place].  Contact Dermatitis Contact dermatitis is a rash that happens when something touches the skin. You touched something that irritates your skin, or you have allergies to something you touched. HOME CARE   Avoid the thing that caused your rash.  Keep your rash away from hot water, soap, sunlight, chemicals, and other things that might bother it.  Do not scratch your rash.  You can take cool baths to help stop itching.  Only take medicine as told by your doctor.  Keep all doctor visits as told. GET HELP RIGHT AWAY IF:   Your rash is not better after 3 days.  Your rash gets worse.  Your rash is puffy (swollen), tender, red, sore, or warm.  You have problems with your medicine. MAKE SURE YOU:   Understand these instructions.  Will watch your condition.  Will get help right away if you are not doing well or get worse. Document Released: 02/07/2009 Document Revised: 07/05/2011 Document Reviewed: 09/15/2010 [facility] Patient Information 2014 [place], [place].

## 2012-10-19 NOTE — ED Provider Notes (Signed)
Medical screening examination/treatment/procedure(s) were performed by non-physician practitioner and as supervising physician I was immediately available for consultation/collaboration. Devoria Albe, MD, Armando Gang   Ward Givens, MD 10/19/12 [phone]

## 2012-10-19 NOTE — ED Notes (Signed)
Black area to toe, seen here for same recently and no better.  Now also has rash on her arms that itch and has small blisters. Alert NAD

## 2012-12-05 ENCOUNTER — Emergency Department (HOSPITAL_COMMUNITY)
Admission: EM | Admit: 2012-12-05 | Discharge: 2012-12-05 | Disposition: A | Discharge status: Home / Self Care | Payer: Medicaid Other | Attending: Emergency Medicine | Admitting: Emergency Medicine

## 2012-12-05 ENCOUNTER — Encounter (HOSPITAL_COMMUNITY): Payer: Self-pay | Admitting: *Deleted

## 2012-12-05 DIAGNOSIS — L408 Other psoriasis: Secondary | ICD-10-CM | POA: Insufficient documentation

## 2012-12-05 DIAGNOSIS — L409 Psoriasis, unspecified: Secondary | ICD-10-CM

## 2012-12-05 MED ORDER — PREDNISONE (PAK) 10 MG PO TABS: 10.0000 mg | ORAL_TABLET | Freq: Every day | ORAL | Status: DC

## 2012-12-05 MED ORDER — TRIAMCINOLONE ACETONIDE 0.1 % EX CREA: TOPICAL_CREAM | Freq: Two times a day (BID) | CUTANEOUS | Status: DC

## 2012-12-05 NOTE — ED Provider Notes (Signed)
CSN: [id number]     Arrival date & time 12/05/12  1440 History     First MD Initiated Contact with Patient 12/05/12 1553     Chief Complaint  Patient presents with  . Rash   (Consider location/radiation/quality/duration/timing/severity/associated sxs/prior Treatment) HPI  Tammy Perry is a 32 y.o. female who presents to the ED with a rash that is located on her elbows and knees, she states she gets the same rash in the same place every year and sometimes more than once a year. This rash has been there for about a week. She denies any other problems.   History reviewed. No pertinent past medical history. History reviewed. No pertinent past surgical history. History reviewed. No pertinent family history. History  Substance Use Topics  . Smoking status: Never Smoker   . Smokeless tobacco: Not on file  . Alcohol Use: No   OB History   Grav Para Term Preterm Abortions TAB SAB Ect Mult Living                 Review of Systems  Constitutional: Negative for fever and chills.  HENT: Negative for neck pain.   Gastrointestinal: Negative for nausea and vomiting.  Skin: Positive for rash.  Neurological: Negative for headaches.  Psychiatric/Behavioral: The patient is not nervous/anxious.     Allergies  Review of patient's allergies indicates no known allergies.  Home Medications   Current Outpatient Rx  Name  Route  Sig  Dispense  Refill  . hydrOXYzine (ATARAX/VISTARIL) 25 MG tablet   Oral   Take 1 tablet (25 mg total) by mouth every 6 (six) hours as needed for itching.   20 tablet   0    BP 121/90  Pulse 81  Temp(Src) 99.1 F (37.3 C) (Oral)  Resp 18  Ht 5\' 6"  (1.676 m)  Wt 180 lb (81.647 kg)  BMI 29.07 kg/m2  SpO2 100%  LMP 11/12/2012 Physical Exam  Nursing note and vitals reviewed. Constitutional: She is oriented to person, place, and time. She appears well-developed and well-nourished. No distress.  HENT:  Head: Normocephalic.  Eyes: EOM are normal.   Neck: Neck supple.  Cardiovascular: Normal rate.   Pulmonary/Chest: Effort normal.  Abdominal: Soft. There is no tenderness.  Musculoskeletal:  Rash noted bilateral knees and elbows that is scaly, thick, raised with itching. Consistent with psoriasis.   Neurological: She is alert and oriented to person, place, and time. No cranial nerve deficit.  Skin: Skin is warm and dry. Rash noted.  Psychiatric: She has a normal mood and affect. Her behavior is normal.    ED Course   Procedures  Labs Reviewed - No data to display No results found. 1. Psoriasis     MDM  32 y.o. female with rash on knees and elbows c/w psoriasis. Will treat with prednisone and  Kenlog cream.    Medication List    TAKE these medications       predniSONE 10 MG tablet  Commonly known as:  STERAPRED UNI-PAK  Take 1 tablet (10 mg total) by mouth daily. Take 6 tablets by mouth day one followed by 5, 4, 3, 2, 1     triamcinolone cream 0.1 %  Commonly known as:  KENALOG  Apply topically 2 (two) times daily.      ASK your doctor about these medications       hydrOXYzine 25 MG tablet  Commonly known as:  ATARAX/VISTARIL  Take 1 tablet (25 mg total) by mouth every  6 (six) hours as needed for itching.         [place], [place] 12/05/12 [phone]

## 2012-12-05 NOTE — ED Notes (Signed)
Pt c/o rash to bilateral elbows and knees x 1 week, denies fever or N/V

## 2012-12-06 NOTE — ED Provider Notes (Signed)
Medical screening examination/treatment/procedure(s) were performed by non-physician practitioner and as supervising physician I was immediately available for consultation/collaboration. Devoria Albe, MD, Armando Gang   Ward Givens, MD 12/06/12 1136

## 2013-05-30 ENCOUNTER — Emergency Department (HOSPITAL_COMMUNITY)
Admission: EM | Admit: 2013-05-30 | Discharge: 2013-05-30 | Disposition: A | Discharge status: Home / Self Care | Payer: Medicaid Other | Attending: Emergency Medicine | Admitting: Emergency Medicine

## 2013-05-30 ENCOUNTER — Encounter (HOSPITAL_COMMUNITY): Payer: Self-pay | Admitting: Emergency Medicine

## 2013-05-30 DIAGNOSIS — X500XXA Overexertion from strenuous movement or load, initial encounter: Secondary | ICD-10-CM | POA: Insufficient documentation

## 2013-05-30 DIAGNOSIS — Y9289 Other specified places as the place of occurrence of the external cause: Secondary | ICD-10-CM | POA: Insufficient documentation

## 2013-05-30 DIAGNOSIS — Y9389 Activity, other specified: Secondary | ICD-10-CM | POA: Insufficient documentation

## 2013-05-30 DIAGNOSIS — Y990 Civilian activity done for income or pay: Secondary | ICD-10-CM | POA: Insufficient documentation

## 2013-05-30 DIAGNOSIS — T148XXA Other injury of unspecified body region, initial encounter: Secondary | ICD-10-CM

## 2013-05-30 DIAGNOSIS — IMO0002 Reserved for concepts with insufficient information to code with codable children: Secondary | ICD-10-CM | POA: Insufficient documentation

## 2013-05-30 MED ORDER — NAPROXEN 500 MG PO TABS: 500.0000 mg | ORAL_TABLET | Freq: Two times a day (BID) | ORAL | Status: DC

## 2013-05-30 MED ORDER — DIAZEPAM 5 MG PO TABS: 5.0000 mg | ORAL_TABLET | Freq: Four times a day (QID) | ORAL | Status: DC | PRN

## 2013-05-30 NOTE — Discharge Instructions (Signed)

## 2013-05-30 NOTE — ED Provider Notes (Signed)
Medical screening examination/treatment/procedure(s) were performed by non-physician practitioner and as supervising physician I was immediately available for consultation/collaboration.  EKG Interpretation   None       Devoria AlbeIva Cesia Orf, MD, Armando GangFACEP   Ward GivensIva L Frank Novelo, MD 05/30/13 1150

## 2013-05-30 NOTE — ED Provider Notes (Signed)
CSN: [id number]     Arrival date & time 05/30/13  1057 History   First MD Initiated Contact with Patient 05/30/13 1118     No chief complaint on file.  (Consider location/radiation/quality/duration/timing/severity/associated sxs/prior Treatment) HPI Comments: Patient is a 33 year old female who presents today with back pain since yesterday. She reports that she began a new job on Monday that requires a significant more lifting and moving and she is used to. Initially the pain began as a soreness in her chest and has moved into her back. The pain is worse with movement and palpation. The pain is achy and is not currently radiating from her low back. She denies any difficulty walking. She put aspirin cream on her chest which improved the pain. She denies any symptoms including fever, chills, shortness of breath, bowel or bladder incontinence, drug use, history of cancer.  The history is provided by the patient. No language interpreter was used.    History reviewed. No pertinent past medical history. History reviewed. No pertinent past surgical history. No family history on file. History  Substance Use Topics  . Smoking status: Never Smoker   . Smokeless tobacco: Not on file  . Alcohol Use: No   OB History   Grav Para Term Preterm Abortions TAB SAB Ect Mult Living                 Review of Systems  Constitutional: Negative for fever and chills.  Respiratory: Negative for shortness of breath.   Gastrointestinal: Negative for nausea, vomiting and abdominal pain.  Genitourinary: Negative for dysuria and difficulty urinating.  Musculoskeletal: Positive for back pain and myalgias.  All other systems reviewed and are negative.    Allergies  Review of patient's allergies indicates no known allergies.  Home Medications  No current outpatient prescriptions on file. BP 156/70  Pulse 80  Temp(Src) 98.7 F (37.1 C) (Oral)  Resp 20  Ht 5\' 6"  (1.676 m)  Wt 185 lb (83.915 kg)  BMI 29.87  kg/m2  SpO2 100%  LMP 05/15/2013 Physical Exam  Nursing note and vitals reviewed. Constitutional: She is oriented to person, place, and time. She appears well-developed and well-nourished. She does not appear ill. No distress.  Patient appears very comfortable, resting in NAD on the bed.  HENT:  Head: Normocephalic and atraumatic.  Right Ear: External ear normal.  Left Ear: External ear normal.  Nose: Nose normal.  Mouth/Throat: Oropharynx is clear and moist.  Eyes: Conjunctivae are normal.  Neck: Normal range of motion.  Cardiovascular: Normal rate, regular rhythm, normal heart sounds, intact distal pulses and normal pulses.   Pulses:      Radial pulses are 2+ on the right side, and 2+ on the left side.       Posterior tibial pulses are 2+ on the right side, and 2+ on the left side.  Pulmonary/Chest: Effort normal and breath sounds normal. No stridor. No respiratory distress. She has no wheezes. She has no rales. She exhibits tenderness.    Abdominal: Soft. She exhibits no distension.  Musculoskeletal: Normal range of motion.       Thoracic back: She exhibits no bony tenderness.       Lumbar back: She exhibits tenderness. She exhibits normal range of motion and no bony tenderness.       Back:  Neurological: She is alert and oriented to person, place, and time. She has normal strength.  Skin: Skin is warm and dry. She is not diaphoretic. No erythema.  Psychiatric: She has a normal mood and affect. Her behavior is normal.    ED Course  Procedures (including critical care time) Labs Review Labs Reviewed - No data to display Imaging Review No results found.  EKG Interpretation   None       MDM   1. Muscle strain    Patient with musculoskeletal.  No neurological deficits and normal neuro exam.  Patient can walk.  No loss of bowel or bladder control.  No concern for cauda equina.  No fever, night sweats, weight loss, h/o cancer, IVDU.  RICE protocol and pain medicine  indicated and discussed with patient.     Mora BellmanHannah S Nathan Stallworth, PA-C 05/30/13 1149

## 2013-05-30 NOTE — ED Notes (Signed)
Pt reports does a lot of lifting and moving at work.  Last night started having pain in upper back and chest with movment.  Says only hurts when moves around.  Denies any n/v or SOB.

## 2014-04-11 ENCOUNTER — Emergency Department (HOSPITAL_COMMUNITY)
Admission: EM | Admit: 2014-04-11 | Discharge: 2014-04-11 | Disposition: A | Discharge status: Home / Self Care | Payer: Medicaid Other | Attending: Emergency Medicine | Admitting: Emergency Medicine

## 2014-04-11 ENCOUNTER — Encounter (HOSPITAL_COMMUNITY): Payer: Self-pay | Admitting: Emergency Medicine

## 2014-04-11 DIAGNOSIS — Z791 Long term (current) use of non-steroidal anti-inflammatories (NSAID): Secondary | ICD-10-CM | POA: Insufficient documentation

## 2014-04-11 DIAGNOSIS — Z792 Long term (current) use of antibiotics: Secondary | ICD-10-CM | POA: Diagnosis not present

## 2014-04-11 DIAGNOSIS — J029 Acute pharyngitis, unspecified: Secondary | ICD-10-CM

## 2014-04-11 LAB — RAPID STREP SCREEN (MED CTR MEBANE ONLY): STREPTOCOCCUS, GROUP A SCREEN (DIRECT): NEGATIVE

## 2014-04-11 MED ORDER — AMOXICILLIN 500 MG PO CAPS: 500.0000 mg | ORAL_CAPSULE | Freq: Three times a day (TID) | ORAL | Status: AC

## 2014-04-11 MED ORDER — IBUPROFEN 800 MG PO TABS
800.0000 mg | ORAL_TABLET | Freq: Once | ORAL | Status: AC
  Administered 2014-04-11: 800 mg via ORAL
  Filled 2014-04-11: qty 1

## 2014-04-11 NOTE — ED Provider Notes (Signed)
CSN: [id number]     Arrival date & time 04/11/14  [id number] History   First MD Initiated Contact with Patient 04/11/14 0820     Chief Complaint  Patient presents with  . Sore Throat     (Consider location/radiation/quality/duration/timing/severity/associated sxs/prior Treatment) The history is provided by the patient.   Tammy Perry is a 33 y.o. female presenting with sore throat and subjective fever for the past 3 days.  She has a history of frequent episodes of strep throat which this reminds her of.  She denies nasal congestion or drainage, ear pain, cough or shortness of breath, also denies chest pain, abdominal pain, nausea or vomiting.  She has used an OTC sore throat spray and utilized warm salt water gargles without relief of symptoms.`     History reviewed. No pertinent past medical history. History reviewed. No pertinent past surgical history. Family History  Problem Relation Age of Onset  . Cancer Mother    History  Substance Use Topics  . Smoking status: Never Smoker   . Smokeless tobacco: Never Used  . Alcohol Use: No   OB History    Gravida Para Term Preterm AB TAB SAB Ectopic Multiple Living   2 2 2       2      Review of Systems  Constitutional: Positive for chills.  HENT: Positive for sore throat. Negative for congestion, ear pain, rhinorrhea, sinus pressure, trouble swallowing and voice change.   Eyes: Negative for discharge.  Respiratory: Negative for cough, shortness of breath, wheezing and stridor.   Cardiovascular: Negative for chest pain.  Gastrointestinal: Negative for abdominal pain.  Genitourinary: Negative.       Allergies  Review of patient's allergies indicates no known allergies.  Home Medications   Prior to Admission medications   Medication Sig Start Date End Date Taking? Authorizing Provider  amoxicillin (AMOXIL) 500 MG capsule Take 1 capsule (500 mg total) by mouth 3 (three) times daily. 04/11/14 04/21/14  Burgess AmorJulie Jakevious Hollister, PA-C   diazepam (VALIUM) 5 MG tablet Take 1 tablet (5 mg total) by mouth every 6 (six) hours as needed for anxiety or muscle spasms (spasms). 05/30/13   Mora BellmanHannah S Merrell, PA-C  naproxen (NAPROSYN) 500 MG tablet Take 1 tablet (500 mg total) by mouth 2 (two) times daily with a meal. 05/30/13   Mora BellmanHannah S Merrell, PA-C   BP 123/94 mmHg  Pulse 82  Temp(Src) 98.6 F (37 C) (Oral)  Resp 16  Ht 5\' 6"  (1.676 m)  Wt 180 lb (81.647 kg)  BMI 29.07 kg/m2  SpO2 99%  LMP 04/10/2014 Physical Exam  Constitutional: She is oriented to person, place, and time. She appears well-developed and well-nourished.  HENT:  Head: Normocephalic and atraumatic.  Right Ear: Tympanic membrane and ear canal normal.  Left Ear: Tympanic membrane and ear canal normal.  Nose: No mucosal edema or rhinorrhea.  Mouth/Throat: Uvula is midline and mucous membranes are normal. Posterior oropharyngeal erythema present. No oropharyngeal exudate, posterior oropharyngeal edema or tonsillar abscesses.  Mild bilateral tonsillar erythema, 1+ bilateral tonsils which are symmetric.  No exudate.  Eyes: Conjunctivae are normal.  Cardiovascular: Normal rate and normal heart sounds.   Pulmonary/Chest: Effort normal. No respiratory distress. She has no wheezes. She has no rales.  Abdominal: Soft. There is no tenderness.  Musculoskeletal: Normal range of motion.  Lymphadenopathy:       Head (left side): Tonsillar adenopathy present.  Neurological: She is alert and oriented to person, place, and time.  Skin:  Skin is warm and dry. No rash noted.  Psychiatric: She has a normal mood and affect.    ED Course  Procedures (including critical care time) Labs Review Labs Reviewed  RAPID STREP SCREEN  CULTURE, GROUP A STREP    Imaging Review No results found.   EKG Interpretation None      MDM   Final diagnoses:  Pharyngitis    Discussed treatment with antibiotics versus labs and cultures.  She does endorse recent strep exposure, but wants  tests run too.  Despite negative rapid test,  Will cover with amoxil given exposure and Centor criteria suggesting strep more likely than viral process given lack of other sx.     Burgess AmorJulie Bernice Mullin, PA-C 04/11/14 [id number]  Joya Gaskinsonald W Wickline, MD 04/11/14 [phone]

## 2014-04-11 NOTE — Discharge Instructions (Signed)
Pharyngitis Pharyngitis is a sore throat (pharynx). There is redness, pain, and swelling of your throat. HOME CARE   Drink enough fluids to keep your pee (urine) clear or pale yellow.  Only take medicine as told by your doctor.  You may get sick again if you do not take medicine as told. Finish your medicines, even if you start to feel better.  Do not take aspirin.  Rest.  Rinse your mouth (gargle) with salt water ( tsp of salt per 1 qt of water) every 1-2 hours. This will help the pain.  If you are not at risk for choking, you can suck on hard candy or sore throat lozenges. GET HELP IF:  You have large, tender lumps on your neck.  You have a rash.  You cough up green, yellow-brown, or bloody spit. GET HELP RIGHT AWAY IF:   You have a stiff neck.  You drool or cannot swallow liquids.  You throw up (vomit) or are not able to keep medicine or liquids down.  You have very bad pain that does not go away with medicine.  You have problems breathing (not from a stuffy nose). MAKE SURE YOU:   Understand these instructions.  Will watch your condition.  Will get help right away if you are not doing well or get worse. Document Released: 09/29/2007 Document Revised: 01/31/2013 Document Reviewed: 12/18/2012 [facility] Patient Information 2015 [place]. This information is not intended to replace advice given to you by your health care provider. Make sure you discuss any questions you have with your health care provider.   Emergency Department Resource Guide 1) Find a Doctor and Pay Out of Pocket Although you won't have to find out who is covered by your insurance plan, it is a good idea to ask around and get recommendations. You will then need to call the office and see if the doctor you have chosen will accept you as a new patient and what types of options they offer for patients who are self-pay. Some doctors offer discounts or will set up payment plans for their patients  who do not have insurance, but you will need to ask so you aren't surprised when you get to your appointment.  2) Contact Your Local Health Department Not all health departments have doctors that can see patients for sick visits, but many do, so it is worth a call to see if yours does. If you don't know where your local health department is, you can check in your phone book. The CDC also has a tool to help you locate your state's health department, and many state websites also have listings of all of their local health departments.  3) Find a Walk-in Clinic If your illness is not likely to be very severe or complicated, you may want to try a walk in clinic. These are popping up all over the country in pharmacies, drugstores, and shopping centers. They're usually staffed by nurse practitioners or physician assistants that have been trained to treat common illnesses and complaints. They're usually fairly quick and inexpensive. However, if you have serious medical issues or chronic medical problems, these are probably not your best option.  No Primary Care Doctor: - Call Health Connect at  [phone] - they can help you locate a primary care doctor that  accepts your insurance, provides certain services, etc. - Physician Referral Service- [phone]  Chronic Pain Problems: Organization         Address  Phone   Notes  [facility]  [phone] Patients need to be referred by their primary care doctor.   Medication Assistance: Organization         Address  Phone   Notes  [facility] Medication [facility] [address] [place], [place] [address] [phone] --Must be a resident of [facility] -- Must have NO insurance coverage whatsoever (no Medicaid/ Medicare, etc.) -- The pt. MUST have a primary care doctor that directs their care regularly and follows them in the community   [facility]  [phone]   [employer]  [phone]     Agencies that provide inexpensive medical care: Organization         Address  Phone   Notes  [facility]  [phone]   [facility]    [phone]   [facility] [address] [place], [place] [address] [phone]   [facility] [address], [place] [phone]   Planned Parenthood    [phone]   [facility]    [phone]   [facility]  [address], [place] Phone:  [phone], Fax:  [phone] Hours of Operation:  9 am - 6 pm, M-F.  Also accepts Medicaid/Medicare and self-pay.  [facility]  [address], [place] Phone: [phone], Fax: [phone]. Hours of Operation:  8:30 am - 5:30 pm, M-F.  Also accepts Medicaid and self-pay.  [facility] [place] [address], [place] Phone: [phone]   [facility] [address] Natasha Bence [place], [place] [phone], Ext. [phone] Mondays & Thursdays: 7-9 AM.  First 15 patients are seen on a first come, first serve basis.    Medicaid-accepting [facility] Providers:  Organization         Address  Phone   Notes  [facility] [address], [place] [phone] Also accepts self-pay patients.  [facility] [address] Laurell Josephs [place], [place]  [phone]   [facility] [address], [place] [phone]   [facility] [address] [place] [phone]   Renaye Rakers [address], [place]   [phone] Only accepts [place] Access [place] patients after they have their name applied to their card.   Self-Pay (no insurance) in [facility]:  Organization         Address  Phone   Notes  Sickle Cell Patients, [facility] Medicine [address] [place], [place] [phone]    [facility]gent Care [address] [place], [place] [phone]   [facility]  [address], [place] [phone]   [facility]/Dr. Osei-Bonsu  [address] [place] or [address], [place] [phone] Phone number for both [place] and [place] locations is the same.  [facility] [address], [place] [phone]   [facility] [address] [place] or [address] [phone]   [facility] [address] [place] [phone], phone; [phone], fax Sees patients 1st and 3rd Saturday of every month.  Must not qualify for public or private insurance (i.e. Medicaid,  Medicare, [place] Health Choice, Veterans' Benefits)  Household income should be no more than 200% of the poverty level The clinic cannot treat you if you are pregnant or think you are pregnant  Sexually transmitted diseases are not treated at the clinic.    Dental Care: Organization         Address  Phone  Notes  [facility] Department of [facility] [address] [place], [place] [phone] Accepts children up to age 26 who are enrolled in [place] or [place] Health Choice; pregnant women with a Medicaid card; and children who have applied for Medicaid or [place] Health Choice, but were declined, whose parents can pay a reduced fee at time of service.  [facility] Department of [facility]  [address], [place] [phone] Accepts children up to age 44 who are enrolled in [place] or [place] Health Choice; pregnant women with a Medicaid card; and children who have applied for Medicaid or [place] Health Choice, but were declined, whose parents can pay a reduced fee at time of service.  [facility]  [address] [place], [place] [phone] Patients are seen by  appointment only. Walk-ins are not accepted. [facility] will see patients 88 years of age and older. Monday - Tuesday (8am-5pm) Most Wednesdays (8:30-5pm) $30 per visit, cash only  [facility]  [address], [facility] [phone] Patients are seen by appointment only. Walk-ins are not accepted. [facility] will see patients 51 years of age and older. One Wednesday Evening (Monthly: Volunteer Based).  $30 per visit, cash only  [employer]  [phone] for adults; Children under age 5, call Graduate Pediatric Dentistry at [phone]. Children aged 62-14, please call [phone] to request a pediatric application.  Dental services are provided in all areas of dental care including fillings, crowns and bridges, complete and partial dentures, implants, gum treatment, root canals, and extractions. Preventive care is also provided. Treatment is provided to both adults and children. Patients are selected via a lottery and there is often a waiting list.   [facility] [address] [place]  [phone] [email or web address]   [facility] [address] [place], [place] [phone], Ext. [phone] Second and Fourth Thursday of each month, opens at 6:30 AM; Clinic ends at 9 AM.  Patients are seen on a first-come first-served basis, and a limited number are seen during each clinic.   [facility]  [address] Ether Griffins [place], [place] [phone]   Eligibility Requirements You must have lived in [place], [place], or [place] counties for at least the last three months.   You cannot be eligible for state or federal sponsored [place], including [employer], [place], or [employer].   You generally cannot be eligible for healthcare insurance through your employer.    How to apply: Eligibility screenings are held every Tuesday and Wednesday afternoon from 1:00 pm until 4:00 pm. You  do not need an appointment for the interview!  [facility] [address] [place], [place] [phone]   [facility] Health Department  [phone]   [facility] Health Department  [phone]   [facility] Health Department  [phone]    Behavioral Health Resources in the Community: Intensive Outpatient Programs Organization         Address  Phone  Notes  [facility] [address], [place], [place] [phone]   [facility] Outpatient [address], [place], [place] [phone]   [facility] [address] [place], [place]  [phone]   [facility] Mental Health [address],  [place], [place] [phone] or [phone]   Substance Abuse Resources Organization         Address  Phone  Notes  [facility]  [phone]   [facility]  [phone]   [facility]  [phone]   Floydene Flock  [phone]   Residential & Outpatient Substance Abuse Program  [phone]   Psychological Services Organization         Address  Phone  Notes  [facility] Behavioral Health  [phone]   [facility]  [phone]   [facility] Mental Health [address], [place] [phone] or [phone]    Mobile Crisis Teams Organization         Address  Phone  Notes  [facility], Mobile Crisis Care Unit  [phone]   [facility]  [address] [place], [place] [phone]   Doristine Locks [address] [place] [phone]    Self-Help/Support Groups Organization         Address  Phone             Notes  [facility] [place] - variety of support groups  336- I7437963 Call for more information  Narcotics Anonymous (NA), [facility] [address], [employer] [place]  2 meetings at this location   Statistician          Address  Phone  Notes  [facility] [address],    [place]  [phone]   [facility]  [address], [place] [address], [place], [place] [phone]   [facility] [address] [place], [place] [phone] Admissions: 8am-3pm M-F  [facility] [address]    [place], [place] [phone]   [facility] [address] [place], [place], [place] [phone]   [facility] [address],  [place], [place] [phone]   [facility] - Intensive Outpatient [address] Laurell Josephs [address], [place], [place] [phone]   [facility] ([facility] [address] [place].,  [place], [place] [phone] or [phone]   [facility] ([facility]) [address] [place], [place] [phone] Accepts Medicaid  [facility] [address],  [place] [phone] Substance Abuse/Addiction Treatment   [facility] Organization         Address  Phone  Notes  [facility]  [phone]   Angie Fava, PhD [address] Ervin Knack [place], [place]   [phone] or [phone]   [facility] Behavioral   [address] [place], [place] [phone]   [facility] [address] [place], [place] [phone] Insurance/Medicaid/sponsorship through [facility] Families [address]                                    [place], [place] [phone] Therapy/tele-psych/case  [facility] [address], [place] [phone]    Dr. Lolly Mustache  [phone]   [facility]  [employer] [facility] Dept. 1) [address], [place] 2) [address], [place] 3)  [address], [place] [phone]) [phone](336) [phone]  [phone](336) [phone]   [facility] Child Abuse Hotline (332)177-0107(336) (912) [phone] or [phone](336) [phone] (After Hours)       Take the entire course of antibiotics  prescribed.  Your rapid strep is negative, but your culture is still pending as discussed.   Use motrin for pain relief, warm salt gargles can also be helpful.

## 2014-04-11 NOTE — ED Notes (Signed)
Patient c/o sore throat x3 days. Patient reports using over-the-counter throat spray with no relief. Per patient starting to hurt when swallowing. Denies any fevers.

## 2014-04-13 LAB — CULTURE, GROUP A STREP

## 2014-06-29 ENCOUNTER — Emergency Department (HOSPITAL_COMMUNITY)
Admission: EM | Admit: 2014-06-29 | Discharge: 2014-06-29 | Disposition: A | Discharge status: Home / Self Care | Payer: Medicaid Other | Attending: Emergency Medicine | Admitting: Emergency Medicine

## 2014-06-29 ENCOUNTER — Encounter (HOSPITAL_COMMUNITY): Payer: Self-pay | Admitting: Emergency Medicine

## 2014-06-29 DIAGNOSIS — T492X5A Adverse effect of local astringents and local detergents, initial encounter: Secondary | ICD-10-CM | POA: Diagnosis not present

## 2014-06-29 DIAGNOSIS — Y9389 Activity, other specified: Secondary | ICD-10-CM | POA: Insufficient documentation

## 2014-06-29 DIAGNOSIS — Y9289 Other specified places as the place of occurrence of the external cause: Secondary | ICD-10-CM | POA: Insufficient documentation

## 2014-06-29 DIAGNOSIS — Z791 Long term (current) use of non-steroidal anti-inflammatories (NSAID): Secondary | ICD-10-CM | POA: Insufficient documentation

## 2014-06-29 DIAGNOSIS — Y998 Other external cause status: Secondary | ICD-10-CM | POA: Diagnosis not present

## 2014-06-29 DIAGNOSIS — R21 Rash and other nonspecific skin eruption: Secondary | ICD-10-CM | POA: Insufficient documentation

## 2014-06-29 DIAGNOSIS — T7840XA Allergy, unspecified, initial encounter: Secondary | ICD-10-CM

## 2014-06-29 MED ORDER — PREDNISONE 20 MG PO TABS: 40.0000 mg | ORAL_TABLET | Freq: Every day | ORAL | Status: DC

## 2014-06-29 MED ORDER — PREDNISONE 50 MG PO TABS
60.0000 mg | ORAL_TABLET | Freq: Once | ORAL | Status: AC
  Administered 2014-06-29: 60 mg via ORAL
  Filled 2014-06-29 (×2): qty 1

## 2014-06-29 MED ORDER — FAMOTIDINE 20 MG PO TABS
40.0000 mg | ORAL_TABLET | Freq: Once | ORAL | Status: AC
  Administered 2014-06-29: 40 mg via ORAL
  Filled 2014-06-29: qty 2

## 2014-06-29 NOTE — ED Provider Notes (Signed)
CSN: [id number]     Arrival date & time 06/29/14  1542 History   First MD Initiated Contact with Patient 06/29/14 1617     Chief Complaint  Patient presents with  . Rash      HPI Pt was seen at 1620. Per pt, c/o gradual onset and persistence of constant "itchy rash" that started 2 days ago. Rash began after she "changed laundry soaps." Has been taking benadryl with improvement in itching. Denies SOB/wheezing, no dysphagia, no stridor.    History reviewed. No pertinent past medical history.   History reviewed. No pertinent past surgical history.   Family History  Problem Relation Age of Onset  . Cancer Mother    History  Substance Use Topics  . Smoking status: Never Smoker   . Smokeless tobacco: Never Used  . Alcohol Use: No   OB History    Gravida Para Term Preterm AB TAB SAB Ectopic Multiple Living   2 2 2       2      Review of Systems ROS: Statement: All systems negative except as marked or noted in the HPI; Constitutional: Negative for fever and chills. ; ; Eyes: Negative for eye pain, redness and discharge. ; ; ENMT: Negative for ear pain, hoarseness, nasal congestion, sinus pressure and sore throat. ; ; Cardiovascular: Negative for chest pain, palpitations, diaphoresis, dyspnea and peripheral edema. ; ; Respiratory: Negative for cough, wheezing and stridor. ; ; Gastrointestinal: Negative for nausea, vomiting, diarrhea, abdominal pain, blood in stool, hematemesis, jaundice and rectal bleeding. . ; ; Genitourinary: Negative for dysuria, flank pain and hematuria. ; ; Musculoskeletal: Negative for back pain and neck pain. Negative for swelling and trauma.; ; Skin: +itching rash. Negative for abrasions, blisters, bruising and skin lesion.; ; Neuro: Negative for headache, lightheadedness and neck stiffness. Negative for weakness, altered level of consciousness , altered mental status, extremity weakness, paresthesias, involuntary movement, seizure and syncope.        Allergies   Review of patient's allergies indicates no known allergies.  Home Medications   Prior to Admission medications   Medication Sig Start Date End Date Taking? Authorizing Provider  diazepam (VALIUM) 5 MG tablet Take 1 tablet (5 mg total) by mouth every 6 (six) hours as needed for anxiety or muscle spasms (spasms). 05/30/13   Mora BellmanHannah S Merrell, PA-C  naproxen (NAPROSYN) 500 MG tablet Take 1 tablet (500 mg total) by mouth 2 (two) times daily with a meal. 05/30/13   Mora BellmanHannah S Merrell, PA-C   BP 134/77 mmHg  Pulse 95  Temp(Src) 97.5 F (36.4 C) (Oral)  Resp 18  Ht 5\' 6"  (1.676 m)  Wt 190 lb (86.183 kg)  BMI 30.68 kg/m2  SpO2 97%  LMP 06/28/2014 Physical Exam  1625: Physical examination:  Nursing notes reviewed; Vital signs and O2 SAT reviewed;  Constitutional: Well developed, Well nourished, Well hydrated, In no acute distress; Head:  Normocephalic, atraumatic; Eyes: EOMI, PERRL, No scleral icterus; ENMT: Mouth and pharynx normal, Mucous membranes moist. Mouth and pharynx without lesions. No tonsillar exudates. No intra-oral edema. No submandibular or sublingual edema. No hoarse voice, no drooling, no stridor. No trismus.; Neck: Supple, Full range of motion, No lymphadenopathy; Cardiovascular: Regular rate and rhythm, No murmur, rub, or gallop; Respiratory: Breath sounds clear & equal bilaterally, No rales, rhonchi, wheezes.  Speaking full sentences with ease, Normal respiratory effort/excursion; Chest: Nontender, Movement normal; Abdomen: Soft, Nontender, Nondistended, Normal bowel sounds; Genitourinary: No CVA tenderness; Extremities: Pulses normal, No tenderness, No edema,  No calf edema or asymmetry.; Neuro: AA&Ox3, Major CN grossly intact.  Speech clear. No gross focal motor or sensory deficits in extremities. Climbs on and off stretcher easily by herself. Gait steady.; Skin: Color normal, Warm, Dry, +scattered small maculopapular rash to arms, anterior and posterior torso that pt is rubbing during  exam. No blisters, no pustules, no petechiae, no open wounds.    ED Course  Procedures      EKG Interpretation None      MDM  MDM Reviewed: nursing note, previous chart and vitals      1625:  Appears to be allergic reaction to detergent. Tx symptomatically at this time. Dx d/w pt.  Questions answered.  Verb understanding, agreeable to d/c home with outpt f/u.   Samuel Jester, DO 07/02/14 [phone]

## 2014-06-29 NOTE — ED Notes (Signed)
Patient c/o rash to entire body x2-3 days. Per patient itching, denies any pain or drainage. Patient "thinks the detergent has recently been change." Patient reports using benadryl with no relief.

## 2014-06-29 NOTE — Discharge Instructions (Signed)
°Emergency Department Resource Guide °1) Find a Doctor and Pay Out of Pocket °Although you won't have to find out who is covered by your insurance plan, it is a good idea to ask around and get recommendations. You will then need to call the office and see if the doctor you have chosen will accept you as a new patient and what types of options they offer for patients who are self-pay. Some doctors offer discounts or will set up payment plans for their patients who do not have insurance, but you will need to ask so you aren't surprised when you get to your appointment. ° °2) Contact Your Local Health Department °Not all health departments have doctors that can see patients for sick visits, but many do, so it is worth a call to see if yours does. If you don't know where your local health department is, you can check in your phone book. The CDC also has a tool to help you locate your state's health department, and many state websites also have listings of all of their local health departments. ° °3) Find a Walk-in Clinic °If your illness is not likely to be very severe or complicated, you may want to try a walk in clinic. These are popping up all over the country in pharmacies, drugstores, and shopping centers. They're usually staffed by nurse practitioners or physician assistants that have been trained to treat common illnesses and complaints. They're usually fairly quick and inexpensive. However, if you have serious medical issues or chronic medical problems, these are probably not your best option. ° °No Primary Care Doctor: °- Call [facility] at  832-8000 - they can help you locate a primary care doctor that  accepts your insurance, provides certain services, etc. °- Physician Referral Service- 1-800-533-3463 ° °Chronic Pain Problems: °Organization         Address  Phone   Notes  °[facility]  (336) 297-2271 Patients need to be referred by their primary care doctor.  ° °Medication  Assistance: °Organization         Address  Phone   Notes  °[place] [facility] [address] °[place], [place] [address] (336) 641-8030 --Must be a resident of [place] °-- Must have NO insurance coverage whatsoever (no Medicaid/ Medicare, etc.) °-- The pt. MUST have a primary care doctor that directs their care regularly and follows them in the community °  °[facility]  (866) 331-1348   °[employer]  (888) 892-1162   ° °Agencies that provide inexpensive medical care: °Organization         Address  Phone   Notes  °[facility]  (336) 832-8035   °[facility]    (336) 832-7272   °[facility] [address] °[place], [place] [address] (336) 832-4777   °[facility] [address], °[place] (336) 271-4999   °[facility]    (336) 373-0678   °[facility]    (336) 272-1050   °[facility] ° [address], [place] Phone:  (336) 832-4444, Fax:  (336) 832-4440 Hours of Operation:  9 am - 6 pm, M-F.  Also accepts Medicaid/Medicare and self-pay.  °[facility] ° [address], [place] Phone: (336) 832-3150, Fax: (336) [phone]. Hours of Operation:  8:30 am - 5:30 pm, M-F.  Also accepts Medicaid and self-pay.  °[facility] [place] [address], [place] Phone: (336) 878-6027   °[facility] [address], [place], [place] (336)723-1848, Ext. 123 Mondays & Thursdays: 7-9 AM.  First 15 patients are seen on a first come, first serve basis. °  ° °Medicaid-accepting [place] Providers: ° °Organization         Address  Phone   Notes  °[facility] [address], [place] (336) 641-2100 Also accepts self-pay patients.  °[facility] [address], [place] ° (336) 856-9996   °[facility] [address], [place]  (336) 288-8857   °[facility] [address] [place] [address], [place] (336) 299-7000   °Veita Bland [address], [place]  ° (336) 373-1557 Only accepts [place] Access Medicaid patients after they have their name applied to their card.  ° °Self-Pay (no insurance) in [place]: ° °Organization         Address  Phone   Notes  °Sickle Cell Patients, [facility] [address], [place] (336) 832-1970   °[facility] [address], [place] (336) 832-4400   °[facility] [place] ° [address], [place] (336) 992-4800   °[facility]/Dr. Osei-Bonsu ° [address] [place] [address], [place] or [address], [place] (336) 841-8500 Phone number for both [place] and [place] locations is the same.  °[facility] [address], [place] (336) 299-0000   °[facility] [address] [place] [address], [place] or [address] (336) 852-7530 °(336) 878-2260   °[facility] [address], [place] (336) 350-1642, phone; (336) 294-5005, fax Sees patients 1st and 3rd Saturday of every month.  Must not qualify for public or private insurance (i.e. Medicaid, Medicare, [place] Health Choice, Veterans' Benefits) • Household income should be no more than 200% of the poverty level •The clinic cannot treat you if you are pregnant or think you are pregnant • Sexually transmitted diseases are not treated at the clinic.  ° ° °Dental Care: °Organization         Address  Phone  Notes  °[place] [facility] [address], [place] (336) 641-6152 Accepts children up to age 21 who are enrolled in Medicaid or [place] Health Choice; pregnant women with a Medicaid card; and children who have applied for Medicaid or [place] Health Choice, but were declined, whose parents can pay a reduced fee at time of service.  °[place]  [facility] [place]  [address], [place] (336) 641-7733 Accepts children up to age 21 who are enrolled in Medicaid or [place] Health Choice; pregnant women with a Medicaid card; and children who have applied for Medicaid or [place] Health Choice, but were declined, whose parents can pay a reduced fee at time of service.  °[facility] ° [address],  (336) 641-4533 Patients are seen by appointment only. Walk-ins are not accepted. [facility] will see patients 18 years of age and older. °Monday - Tuesday (8am-5pm) °Most Wednesdays (8:30-5pm) °$30 per visit, cash only  °[facility] ° [address], [place] (336) 641-4533 Patients are seen by appointment only. Walk-ins are not accepted. [facility] will see patients 18 years of age and older. °One   Wednesday Evening (Monthly: Volunteer Based).  $30 per visit, cash only  °[facility]  (919) 537-3737 for adults; Children under age 4, call Graduate Pediatric Dentistry at ([phone]) [phone]. Children aged 4-14, please call ([phone]) [phone] to request a pediatric application. ° Dental services are provided in all areas of dental care including fillings, crowns and bridges, complete and partial dentures, implants, gum treatment, root canals, and extractions. Preventive care is also provided. Treatment is provided to both adults and children. °Patients are selected via a lottery and there is often a waiting list. °  °[facility] [address], °[place] ° (336) 763-8833 [email or web address] °  °[facility] [address], [place], [place] (336)723-1848, Ext. 123 Second and Fourth Thursday of each month, opens at 6:30 AM; Clinic ends at 9 AM.  Patients are seen on a first-come first-served basis, and a limited number are seen during each clinic.  ° °[facility] ° [address], [place], [place] (336) 723-7904    Eligibility Requirements °You must have lived in [place], [place], or [place] counties for at least the last three months. °  You cannot be eligible for state or federal sponsored healthcare insurance, including Veterans Administration, Medicaid, or Medicare. °  You generally cannot be eligible for healthcare insurance through your employer.  °  How to apply: °Eligibility screenings are held every Tuesday and Wednesday afternoon from 1:00 pm until 4:00 pm. You do not need an appointment for the interview!  °[facility] [address], [place], [place] 336-631-2330   °[facility]  336-342-8273   °[facility]  336-703-3100   °[facility]  336-570-6415   ° °Behavioral Health Resources in the Community: °Intensive Outpatient Programs °Organization         Address  Phone  Notes  °[facility] [address], [place], [place] 336-878-6098   °[facility] [address], [place], [place] 336-832-9800   °[facility] [address], [place], [place] ° 336-882-2125   °[facility] [address],  °[place], [place] 1-800-853-5163 or 336-641-4981   °Substance Abuse Resources °Organization         Address  Phone  Notes  °[facility]  336-882-2125   °[facility]  336-784-9470   °[facility]  336-285-9073   °[facility]  336-845-3988   °Residential & Outpatient Substance Abuse Program  1-800-659-3381   °Psychological Services °Organization         Address  Phone  Notes  °[facility]  336- 832-9600   °[facility]  336- 378-7881   °[facility] [address], [place] 1-800-853-5163 or 336-641-4981   ° °Mobile Crisis Teams °Organization         Address  Phone  Notes  °[facility], Mobile Crisis Care Unit  1-877-626-1772   °Assertive °[facility] ° [address]  [place], [place] 336-834-9664   °Sharon DeEsch [address] °  336-554-5454   ° °Self-Help/Support Groups °Organization         Address  Phone             Notes  °[facility]  - variety of support groups  336- 373-1402 Call for more information  °Narcotics Anonymous (NA), [facility] [address], °[place]   2 meetings at this location  ° °  Residential Treatment Programs °Organization         Address  Phone  Notes  °[facility] [address],    °[place]  1-866-801-8205   °[facility] ° [address], [place], [place] 704-293-8524   °[facility] [address], [place] 336-845-3988 Admissions: 8am-3pm M-F  °[facility] [address]    °[place], [place] 336-841-1104   °[facility] [address], [place], [place] 336-379-7146   °[facility] [address]  °[place], [place] 336-285-9073   °[facility] Intensive Outpatient [address], [place], [place] 336-852-3033   °[facility] [address]  °[place], [place] 1-877-615-2722 or 336-784-9470   °[facility] [address] [place], [place] 336-227-7417 Accepts Medicaid  °[facility] [address]  °[place] 1-800-659-3381 Substance Abuse/Addiction Treatment  ° °[place] Behavioral Health Resources °Organization         Address  Phone  Notes  °[facility]  (888) 581-9988   °Julie Brannon, PhD [address] [place], [place]   (336) 349-5553 or (336) 951-0000   °[facility]   [address] °[place], [place] (336) 349-4454   °[facility] [address], [place], [place] (336) 342-8316 Insurance/Medicaid/sponsorship through [facility]  °[facility] [address]                                    [place], [place] (336) 342-8316 Therapy/tele-psych/case    °[facility] [address]  ° [place], [place] (336) 349-2233    °Dr. Arfeen  (336) 349-4544   °[facility] [place]  [employer] [facility] [place] [facility] 1) [address],  °2) [address], [place] °3)  [address], [place] (336) 349-3220 °(336) 342-7768 ° °(336) 342-8140   °[place] [facility] (336) 342-1394 or (336) 342-3537 (After Hours)    ° ° °Take the prescription as directed.  Take over the counter benadryl, as directed on packaging, as needed for itching.  If the benadryl is too sedating, take an over the counter non-sedating antihistamine such as claritin, allegra or zyrtec, as directed on packaging.  Call your regular medical doctor on Monday to schedule a follow up appointment within the next 3 days.  Return to the Emergency Department immediately sooner if worsening.  ° °. °

## 2014-07-09 ENCOUNTER — Emergency Department (HOSPITAL_COMMUNITY)
Admission: EM | Admit: 2014-07-09 | Discharge: 2014-07-09 | Disposition: A | Discharge status: Home / Self Care | Payer: MEDICAID | Attending: Emergency Medicine | Admitting: Emergency Medicine

## 2014-07-09 ENCOUNTER — Encounter (HOSPITAL_COMMUNITY): Payer: Self-pay | Admitting: Emergency Medicine

## 2014-07-09 DIAGNOSIS — F419 Anxiety disorder, unspecified: Secondary | ICD-10-CM | POA: Diagnosis present

## 2014-07-09 DIAGNOSIS — F410 Panic disorder [episodic paroxysmal anxiety] without agoraphobia: Secondary | ICD-10-CM

## 2014-07-09 DIAGNOSIS — Z791 Long term (current) use of non-steroidal anti-inflammatories (NSAID): Secondary | ICD-10-CM | POA: Insufficient documentation

## 2014-07-09 DIAGNOSIS — Z79899 Other long term (current) drug therapy: Secondary | ICD-10-CM | POA: Diagnosis not present

## 2014-07-09 DIAGNOSIS — R42 Dizziness and giddiness: Secondary | ICD-10-CM

## 2014-07-09 LAB — BASIC METABOLIC PANEL
Anion gap: 4 — ABNORMAL LOW (ref 5–15)
BUN: 16 mg/dL (ref 6–23)
CALCIUM: 8.9 mg/dL (ref 8.4–10.5)
CO2: 28 mmol/L (ref 19–32)
CREATININE: 0.81 mg/dL (ref 0.50–1.10)
Chloride: 107 mmol/L (ref 96–112)
GFR calc Af Amer: >90 mL/min (ref >90)
GLUCOSE: 101 mg/dL — AB (ref 70–99)
Potassium: 4.1 mmol/L (ref 3.5–5.1)
Sodium: 139 mmol/L (ref 135–145)

## 2014-07-09 LAB — CBC WITH DIFFERENTIAL/PLATELET
BASOS ABS: 0 10*3/uL (ref 0.0–0.1)
BASOS PCT: 0 % (ref 0–1)
Eosinophils Absolute: 0.1 10*3/uL (ref 0.0–0.7)
Eosinophils Relative: 1 % (ref 0–5)
HEMATOCRIT: 37.2 % (ref 36.0–46.0)
HEMOGLOBIN: 11.4 g/dL — AB (ref 12.0–15.0)
LYMPHS PCT: 36 % (ref 12–46)
Lymphs Abs: 3.6 10*3/uL (ref 0.7–4.0)
MCH: 21.9 pg — ABNORMAL LOW (ref 26.0–34.0)
MCHC: 30.6 g/dL (ref 30.0–36.0)
MCV: 71.4 fL — AB (ref 78.0–100.0)
MONOS PCT: 6 % (ref 3–12)
Monocytes Absolute: 0.6 10*3/uL (ref 0.1–1.0)
NEUTROS ABS: 5.7 10*3/uL (ref 1.7–7.7)
Neutrophils Relative %: 57 % (ref 43–77)
Platelets: 304 10*3/uL (ref 150–400)
RBC: 5.21 MIL/uL — ABNORMAL HIGH (ref 3.87–5.11)
RDW: 15.3 % (ref 11.5–15.5)
WBC: 10 10*3/uL (ref 4.0–10.5)

## 2014-07-09 NOTE — Discharge Instructions (Signed)
Dizziness °Dizziness is a common problem. It is a feeling of unsteadiness or light-headedness. You may feel like you are about to faint. Dizziness can lead to injury if you stumble or fall. A person of any age group can suffer from dizziness, but dizziness is more common in older adults. °CAUSES  °Dizziness can be caused by many different things, including: °· Middle ear problems. °· Standing for too long. °· Infections. °· An allergic reaction. °· Aging. °· An emotional response to something, such as the sight of blood. °· Side effects of medicines. °· Tiredness. °· Problems with circulation or blood pressure. °· Excessive use of alcohol or medicines, or illegal drug use. °· Breathing too fast (hyperventilation). °· An irregular heart rhythm (arrhythmia). °· A low red blood cell count (anemia). °· Pregnancy. °· Vomiting, diarrhea, fever, or other illnesses that cause body fluid loss (dehydration). °· Diseases or conditions such as Parkinson's disease, high blood pressure (hypertension), diabetes, and thyroid problems. °· Exposure to extreme heat. °DIAGNOSIS  °Your health care provider will ask about your symptoms, perform a physical exam, and perform an electrocardiogram (ECG) to record the electrical activity of your heart. Your health care provider may also perform other heart or blood tests to determine the cause of your dizziness. These may include: °· Transthoracic echocardiogram (TTE). During echocardiography, sound waves are used to evaluate how blood flows through your heart. °· Transesophageal echocardiogram (TEE). °· Cardiac monitoring. This allows your health care provider to monitor your heart rate and rhythm in real time. °· Holter monitor. This is a portable device that records your heartbeat and can help diagnose heart arrhythmias. It allows your health care provider to track your heart activity for several days if needed. °· Stress tests by exercise or by giving medicine that makes the heart beat  faster. °TREATMENT  °Treatment of dizziness depends on the cause of your symptoms and can vary greatly. °HOME CARE INSTRUCTIONS  °· Drink enough fluids to keep your urine clear or pale yellow. This is especially important in very hot weather. In older adults, it is also important in cold weather. °· Take your medicine exactly as directed if your dizziness is caused by medicines. When taking blood pressure medicines, it is especially important to get up slowly. °· Rise slowly from chairs and steady yourself until you feel okay. °· In the morning, first sit up on the side of the bed. When you feel okay, stand slowly while holding onto something until you know your balance is fine. °· Move your legs often if you need to stand in one place for a long time. Tighten and relax your muscles in your legs while standing. °· Have someone stay with you for 1-2 days if dizziness continues to be a problem. Do this until you feel you are well enough to stay alone. Have the person call your health care provider if he or she notices changes in you that are concerning. °· Do not drive or use heavy machinery if you feel dizzy. °· Do not drink alcohol. °SEEK IMMEDIATE MEDICAL CARE IF:  °· Your dizziness or light-headedness gets worse. °· You feel nauseous or vomit. °· You have problems talking, walking, or using your arms, hands, or legs. °· You feel weak. °· You are not thinking clearly or you have trouble forming sentences. It may take a friend or family member to notice this. °· You have chest pain, abdominal pain, shortness of breath, or sweating. °· Your vision changes. °· You notice   any bleeding.  You have side effects from medicine that seems to be getting worse rather than better. MAKE SURE YOU:   Understand these instructions.  Will watch your condition.  Will get help right away if you are not doing well or get worse. Document Released: 10/06/2000 Document Revised: 04/17/2013 Document Reviewed: 10/30/2010 [facility]  Patient Information 2015 [place], [place]. This information is not intended to replace advice given to you by your health care provider. Make sure you discuss any questions you have with your health care provider.  Panic Attacks Panic attacks are sudden, short-livedsurges of severe anxiety, fear, or discomfort. They may occur for no reason when you are relaxed, when you are anxious, or when you are sleeping. Panic attacks may occur for a number of reasons:   Healthy people occasionally have panic attacks in extreme, life-threatening situations, such as war or natural disasters. Normal anxiety is a protective mechanism of the body that helps [place] react to danger (fight or flight response).  Panic attacks are often seen with anxiety disorders, such as panic disorder, social anxiety disorder, generalized anxiety disorder, and phobias. Anxiety disorders cause excessive or uncontrollable anxiety. They may interfere with your relationships or other life activities.  Panic attacks are sometimes seen with other mental illnesses, such as depression and posttraumatic stress disorder.  Certain medical conditions, prescription medicines, and drugs of abuse can cause panic attacks. SYMPTOMS  Panic attacks start suddenly, peak within 20 minutes, and are accompanied by four or more of the following symptoms:  Pounding heart or fast heart rate (palpitations).  Sweating.  Trembling or shaking.  Shortness of breath or feeling smothered.  Feeling choked.  Chest pain or discomfort.  Nausea or strange feeling in your stomach.  Dizziness, light-headedness, or feeling like you will faint.  Chills or hot flushes.  Numbness or tingling in your lips or hands and feet.  Feeling that things are not real or feeling that you are not yourself.  Fear of losing control or going crazy.  Fear of dying. Some of these symptoms can mimic serious medical conditions. For example, you may think you are having a heart  attack. Although panic attacks can be very scary, they are not life threatening. DIAGNOSIS  Panic attacks are diagnosed through an assessment by your health care provider. Your health care provider will ask questions about your symptoms, such as where and when they occurred. Your health care provider will also ask about your medical history and use of alcohol and drugs, including prescription medicines. Your health care provider may order blood tests or other studies to rule out a serious medical condition. Your health care provider may refer you to a mental health professional for further evaluation. TREATMENT   Most healthy people who have one or two panic attacks in an extreme, life-threatening situation will not require treatment.  The treatment for panic attacks associated with anxiety disorders or other mental illness typically involves counseling with a mental health professional, medicine, or a combination of both. Your health care provider will help determine what treatment is best for you.  Panic attacks due to physical illness usually go away with treatment of the illness. If prescription medicine is causing panic attacks, talk with your health care provider about stopping the medicine, decreasing the dose, or substituting another medicine.  Panic attacks due to alcohol or drug abuse go away with abstinence. Some adults need professional help in order to stop drinking or using drugs. HOME CARE INSTRUCTIONS   Take all  medicines as directed by your health care provider.   °· Schedule and attend follow-up visits as directed by your health care provider. It is important to keep all your appointments. °SEEK MEDICAL CARE IF: °· You are not able to take your medicines as prescribed. °· Your symptoms do not improve or get worse. °SEEK IMMEDIATE MEDICAL CARE IF:  °· You experience panic attack symptoms that are different than your usual symptoms. °· You have serious thoughts about hurting yourself or  others. °· You are taking medicine for panic attacks and have a serious side effect. °MAKE SURE YOU: °· Understand these instructions. °· Will watch your condition. °· Will get help right away if you are not doing well or get worse. °Document Released: 04/12/2005 Document Revised: 04/17/2013 Document Reviewed: 11/24/2012 °[employer]® Patient Information ©2015 [employer]. This information is not intended to replace advice given to you by your health care provider. Make sure you discuss any questions you have with your health care provider. ° °

## 2014-07-09 NOTE — ED Notes (Signed)
Started a new job today.  Having anxiety.  Having headache and dizzy spells.  Rates pain 2.  Took Advil  this am, with mild relief.

## 2014-07-09 NOTE — ED Notes (Signed)
Discharge instructions reviewed with pt, questions answered. Pt verbalized understanding.  

## 2014-07-09 NOTE — ED Provider Notes (Signed)
CSN: [id number]     Arrival date & time 07/09/14  1705 History   First MD Initiated Contact with Patient 07/09/14 2013     Chief Complaint  Patient presents with  . Anxiety     (Consider location/radiation/quality/duration/timing/severity/associated sxs/prior Treatment) HPI Comments: Patient is a 34 year old female with past medical history of anxiety. She presents today for evaluation of weakness, dizziness, and near syncope that occurred while at work. She is on her second day of a new job when her symptoms began. These lasted for 20-30 minutes, then resolved. She is now feeling better. She she believes she may have had an anxiety attack.  Patient is a 34 y.o. female presenting with anxiety. The history is provided by the patient.  Anxiety This is a new problem. The current episode started 1 to 2 hours ago. The problem occurs constantly. The problem has been resolved. Pertinent negatives include no chest pain and no shortness of breath. Nothing aggravates the symptoms. Nothing relieves the symptoms. She has tried nothing for the symptoms. The treatment provided no relief.    History reviewed. No pertinent past medical history. History reviewed. No pertinent past surgical history. Family History  Problem Relation Age of Onset  . Cancer Mother    History  Substance Use Topics  . Smoking status: Never Smoker   . Smokeless tobacco: Never Used  . Alcohol Use: No   OB History    Gravida Para Term Preterm AB TAB SAB Ectopic Multiple Living   Review of Systems  Respiratory: Negative for shortness of breath.   Cardiovascular: Negative for chest pain.  All other systems reviewed and are negative.     Allergies  Review of patient's allergies indicates no known allergies.  Home Medications   Prior to Admission medications   Medication Sig Start Date End Date Taking? Authorizing Provider  diazepam (VALIUM) 5 MG tablet Take 1 tablet (5 mg total) by mouth every 6  (six) hours as needed for anxiety or muscle spasms (spasms). 05/30/13   Junious Silk, PA-C  naproxen (NAPROSYN) 500 MG tablet Take 1 tablet (500 mg total) by mouth 2 (two) times daily with a meal. 05/30/13   Junious Silk, PA-C  predniSONE (DELTASONE) 20 MG tablet Take 2 tablets (40 mg total) by mouth daily. Start 06/30/14 06/29/14   Samuel Jester, DO   BP 137/87 mmHg  Pulse 82  Temp(Src) 98.6 F (37 C) (Oral)  Resp 16  Ht  (1.676 m)  Wt 185 lb (83.915 kg)  BMI 29.87 kg/m2  SpO2 99%  LMP 06/28/2014 Physical Exam  Constitutional: She is oriented to person, place, and time. She appears well-developed and well-nourished. No distress.  HENT:  Head: Normocephalic and atraumatic.  Neck: Normal range of motion. Neck supple.  Cardiovascular: Normal rate and regular rhythm.  Exam reveals no gallop and no friction rub.   No murmur heard. Pulmonary/Chest: Effort normal and breath sounds normal. No respiratory distress. She has no wheezes.  Abdominal: Soft. Bowel sounds are normal. She exhibits no distension. There is no tenderness.  Musculoskeletal: Normal range of motion.  Neurological: She is alert and oriented to person, place, and time.  Skin: Skin is warm and dry. She is not diaphoretic.  Nursing note and vitals reviewed.   ED Course  Procedures (including critical care time) Labs Review Labs Reviewed  BASIC METABOLIC PANEL  CBC WITH DIFFERENTIAL/PLATELET    Imaging Review No  results found.   EKG Interpretation   Date/Time:  Tuesday July 09 2014 20:39:55 EDT Ventricular Rate:  81 PR Interval:  149 QRS Duration: 86 QT Interval:  352 QTC Calculation: 408 R Axis:   58 Text Interpretation:  Sinus rhythm Confirmed by DELOS  MD, Jaedah Lords ([id number])  on 07/09/2014 9:18:26 PM      MDM   Final diagnoses:  None    Patient presents here for evaluation of dizziness and near syncope that occurred at work. I suspect her symptoms are anxiety related as her physical examination  and laboratory studies and EKG revealed no alternate diagnosis. She now feels fine. I feel she is appropriate for discharge. She is to follow-up with her primary Dr. for any problems.    Geoffery Lyonsouglas Aalivia Mcgraw, MD 07/09/14 2134

## 2014-08-29 ENCOUNTER — Emergency Department (HOSPITAL_COMMUNITY): Payer: Medicaid Other

## 2014-08-29 ENCOUNTER — Encounter (HOSPITAL_COMMUNITY): Payer: Self-pay | Admitting: Emergency Medicine

## 2014-08-29 ENCOUNTER — Emergency Department (HOSPITAL_COMMUNITY)
Admission: EM | Admit: 2014-08-29 | Discharge: 2014-08-29 | Disposition: A | Discharge status: Home / Self Care | Payer: Medicaid Other | Attending: Emergency Medicine | Admitting: Emergency Medicine

## 2014-08-29 DIAGNOSIS — R509 Fever, unspecified: Secondary | ICD-10-CM | POA: Diagnosis present

## 2014-08-29 DIAGNOSIS — B349 Viral infection, unspecified: Secondary | ICD-10-CM | POA: Diagnosis not present

## 2014-08-29 DIAGNOSIS — Z79899 Other long term (current) drug therapy: Secondary | ICD-10-CM | POA: Diagnosis not present

## 2014-08-29 DIAGNOSIS — D72819 Decreased white blood cell count, unspecified: Secondary | ICD-10-CM | POA: Insufficient documentation

## 2014-08-29 DIAGNOSIS — R079 Chest pain, unspecified: Secondary | ICD-10-CM | POA: Insufficient documentation

## 2014-08-29 DIAGNOSIS — Z7952 Long term (current) use of systemic steroids: Secondary | ICD-10-CM | POA: Insufficient documentation

## 2014-08-29 DIAGNOSIS — Z791 Long term (current) use of non-steroidal anti-inflammatories (NSAID): Secondary | ICD-10-CM | POA: Insufficient documentation

## 2014-08-29 LAB — URINALYSIS, ROUTINE W REFLEX MICROSCOPIC
BILIRUBIN URINE: NEGATIVE
Glucose, UA: NEGATIVE mg/dL
KETONES UR: NEGATIVE mg/dL
Leukocytes, UA: NEGATIVE
Nitrite: NEGATIVE
SPECIFIC GRAVITY, URINE: 1.025 (ref 1.005–1.030)
UROBILINOGEN UA: 0.2 mg/dL (ref 0.0–1.0)
pH: 5.5 (ref 5.0–8.0)

## 2014-08-29 LAB — CBC WITH DIFFERENTIAL/PLATELET
BASOS ABS: 0 10*3/uL (ref 0.0–0.1)
BASOS PCT: 0 % (ref 0–1)
Eosinophils Absolute: 0 10*3/uL (ref 0.0–0.7)
Eosinophils Relative: 0 % (ref 0–5)
HEMATOCRIT: 41.2 % (ref 36.0–46.0)
HEMOGLOBIN: 12.4 g/dL (ref 12.0–15.0)
LYMPHS ABS: 1.1 10*3/uL (ref 0.7–4.0)
LYMPHS PCT: 53 % — AB (ref 12–46)
MCH: 21.3 pg — ABNORMAL LOW (ref 26.0–34.0)
MCHC: 30.1 g/dL (ref 30.0–36.0)
MCV: 70.8 fL — AB (ref 78.0–100.0)
MONOS PCT: 20 % — AB (ref 3–12)
Monocytes Absolute: 0.4 10*3/uL (ref 0.1–1.0)
NEUTROS ABS: 0.5 10*3/uL — AB (ref 1.7–7.7)
Neutrophils Relative %: 27 % — ABNORMAL LOW (ref 43–77)
Platelets: 172 10*3/uL (ref 150–400)
RBC: 5.82 MIL/uL — ABNORMAL HIGH (ref 3.87–5.11)
RDW: 14.9 % (ref 11.5–15.5)
WBC: 2 10*3/uL — AB (ref 4.0–10.5)

## 2014-08-29 LAB — COMPREHENSIVE METABOLIC PANEL
ALK PHOS: 61 U/L (ref 38–126)
ALT: 22 U/L (ref 14–54)
ANION GAP: 6 (ref 5–15)
AST: 27 U/L (ref 15–41)
Albumin: 3.8 g/dL (ref 3.5–5.0)
BUN: 16 mg/dL (ref 6–20)
CALCIUM: 8.5 mg/dL — AB (ref 8.9–10.3)
CO2: 28 mmol/L (ref 22–32)
Chloride: 104 mmol/L (ref 101–111)
Creatinine, Ser: 1 mg/dL (ref 0.44–1.00)
GLUCOSE: 97 mg/dL (ref 70–99)
POTASSIUM: 3.9 mmol/L (ref 3.5–5.1)
SODIUM: 138 mmol/L (ref 135–145)
Total Bilirubin: 0.6 mg/dL (ref 0.3–1.2)
Total Protein: 7.6 g/dL (ref 6.5–8.1)

## 2014-08-29 LAB — URINE MICROSCOPIC-ADD ON

## 2014-08-29 MED ORDER — ACETAMINOPHEN 325 MG PO TABS
ORAL_TABLET | ORAL | Status: AC
  Filled 2014-08-29: qty 2

## 2014-08-29 MED ORDER — ACETAMINOPHEN 325 MG PO TABS
650.0000 mg | ORAL_TABLET | Freq: Once | ORAL | Status: AC
  Administered 2014-08-29: 650 mg via ORAL

## 2014-08-29 NOTE — ED Notes (Signed)
MD at bedside. 

## 2014-08-29 NOTE — ED Notes (Signed)
Patient complaining of generalized body aches and fever x 3 days. Denies n/v/d. Also complaining of "a little cough."

## 2014-08-29 NOTE — ED Provider Notes (Signed)
CSN: [id number]     Arrival date & time 08/29/14  1518 History   First MD Initiated Contact with Patient 08/29/14 1536     Chief Complaint  Patient presents with  . Generalized Body Aches  . Fever     (Consider location/radiation/quality/duration/timing/severity/associated sxs/prior Treatment) Patient is a 34 y.o. female presenting with fever. The history is provided by the patient (the pt complains of a fever and cough for 2 days.  her son has been sick also).  Fever Temp source:  Subjective Severity:  Moderate Onset quality:  Gradual Timing:  Constant Progression:  Waxing and waning Chronicity:  New Relieved by:  Nothing Associated symptoms: chest pain and cough   Associated symptoms: no congestion, no diarrhea, no headaches and no rash     History reviewed. No pertinent past medical history. History reviewed. No pertinent past surgical history. Family History  Problem Relation Age of Onset  . Cancer Mother    History  Substance Use Topics  . Smoking status: Never Smoker   . Smokeless tobacco: Never Used  . Alcohol Use: No   OB History    Gravida Para Term Preterm AB TAB SAB Ectopic Multiple Living   2 2 2       2      Review of Systems  Constitutional: Positive for fever. Negative for appetite change and fatigue.  HENT: Negative for congestion, ear discharge and sinus pressure.   Eyes: Negative for discharge.  Respiratory: Positive for cough.   Cardiovascular: Positive for chest pain.  Gastrointestinal: Negative for abdominal pain and diarrhea.  Genitourinary: Negative for frequency and hematuria.  Musculoskeletal: Negative for back pain.  Skin: Negative for rash.  Neurological: Negative for seizures and headaches.  Psychiatric/Behavioral: Negative for hallucinations.      Allergies  Review of patient's allergies indicates no known allergies.  Home Medications   Prior to Admission medications   Medication Sig Start Date End Date Taking? Authorizing  Provider  Homeopathic Products (COLD RELIEF PO) Take 1-2 tablets by mouth daily as needed (FOR COLD RELIEF).   Yes Historical Provider, MD  ibuprofen (ADVIL,MOTRIN) 200 MG tablet Take 200 mg by mouth every 6 (six) hours as needed for mild pain or moderate pain.   Yes Historical Provider, MD  clotrimazole-betamethasone (LOTRISONE) cream Apply 1 application topically 3 (three) times daily. 07/26/14   Historical Provider, MD  diazepam (VALIUM) 5 MG tablet Take 1 tablet (5 mg total) by mouth every 6 (six) hours as needed for anxiety or muscle spasms (spasms). Patient not taking: Reported on 07/09/2014 05/30/13   Junious SilkHannah Merrell, PA-C  naproxen (NAPROSYN) 500 MG tablet Take 1 tablet (500 mg total) by mouth 2 (two) times daily with a meal. Patient not taking: Reported on 07/09/2014 05/30/13   Junious SilkHannah Merrell, PA-C  predniSONE (DELTASONE) 20 MG tablet Take 2 tablets (40 mg total) by mouth daily. Start 06/30/14 Patient not taking: Reported on 07/09/2014 06/29/14   Samuel JesterKathleen McManus, DO   BP 126/81 mmHg  Pulse 97  Temp(Src) 100.4 F (38 C) (Oral)  Resp 18  Ht 5\' 6"  (1.676 m)  Wt 190 lb (86.183 kg)  BMI 30.68 kg/m2  SpO2 99%  LMP 08/14/2014 Physical Exam  Constitutional: She is oriented to person, place, and time. She appears well-developed.  HENT:  Head: Normocephalic.  Eyes: Conjunctivae and EOM are normal. No scleral icterus.  Neck: Neck supple. No thyromegaly present.  Cardiovascular: Normal rate and regular rhythm.  Exam reveals no gallop and no friction rub.  No murmur heard. Pulmonary/Chest: No stridor. She has no wheezes. She has no rales. She exhibits no tenderness.  Abdominal: She exhibits no distension. There is no tenderness. There is no rebound.  Musculoskeletal: Normal range of motion. She exhibits no edema.  Lymphadenopathy:    She has no cervical adenopathy.  Neurological: She is oriented to person, place, and time. She exhibits normal muscle tone. Coordination normal.  Skin: No rash noted.  No erythema.  Psychiatric: She has a normal mood and affect. Her behavior is normal.    ED Course  Procedures (including critical care time) Labs Review Labs Reviewed  CBC WITH DIFFERENTIAL/PLATELET - Abnormal; Notable for the following:    WBC 2.0 (*)    RBC 5.82 (*)    MCV 70.8 (*)    MCH 21.3 (*)    Neutrophils Relative % 27 (*)    Lymphocytes Relative 53 (*)    Monocytes Relative 20 (*)    Neutro Abs 0.5 (*)    All other components within normal limits  COMPREHENSIVE METABOLIC PANEL - Abnormal; Notable for the following:    Calcium 8.5 (*)    All other components within normal limits  URINALYSIS, ROUTINE W REFLEX MICROSCOPIC - Abnormal; Notable for the following:    Hgb urine dipstick LARGE (*)    Protein, ur TRACE (*)    All other components within normal limits  URINE MICROSCOPIC-ADD ON - Abnormal; Notable for the following:    Squamous Epithelial / LPF MANY (*)    Bacteria, UA MANY (*)    All other components within normal limits  URINE CULTURE    Imaging Review Dg Chest 2 View  08/29/2014   CLINICAL DATA:  Generalized body aches and fever. Three-day history of cough  EXAM: CHEST  2 VIEW  COMPARISON:  January 07, 2011  FINDINGS: Lungs are clear. Heart size and pulmonary vascularity are normal. No pneumothorax. No adenopathy. No bone lesions.  IMPRESSION: No edema or consolidation.   Electronically Signed   By: Bretta BangWilliam  Woodruff III M.D.   On: 08/29/2014 16:23     EKG Interpretation None     Spoke with medicine and will tx as viral syndrome with follow up next week. MDM   Final diagnoses:  Viral syndrome  Leukopenia        Bethann BerkshireJoseph Pamalee Marcoe, MD 08/29/14 [phone]

## 2014-08-29 NOTE — Discharge Instructions (Signed)
Drink plenty of fluids.  Tylenol or motrin for fever or aches.  Follow up next week to have cbc blood work checked again.

## 2014-08-31 LAB — URINE CULTURE: Special Requests: NORMAL

## 2015-04-27 NOTE — L&D Delivery Note (Signed)
Delivery Note IOL for pre-e w/o severe features, received only foley bulb- came out at 2200, srom'd and labored spontaneously. Called by RN at 0105 stating pt 10/+1 no urge to push, no mention of decels. I arrived in room at 0125 to evaluate pt, noted to be having repetitive variables, mom in Lt lateral, positioned semi-fowler to perform SVE and baby's head was actively coming out of vagina w/o any maternal pushing efforts.  At 1:31 AM a viable female was delivered via Vaginal Birth after Cesarean Section VBAC (Presentation: LOA ).  APGAR: 6, 8; weight: pending at time of note.  Infant placed directly on mom's abdomen for bonding/skin-to-skin, infant w/ poor tone/respiratory effort but good color/heart rate.  Delayed cord clamping x 1min while drying/stimulating/assessing, then cord clamped x 2, and cut by fob and infant taken to radiant warmer for care by nursing staff d/t not much improvement w/ interventions.   Placenta status: delivered spontaneously intact on it's own w/o controlled cord traction or maternal pushing efforts. Large gush of blood immediately followed placenta. Suspect partial placental abruption.  Cord: 3VC  with the following complications: circumvallate placenta- pt denies vb during early pregnancy .  Cord pH: obtained good samples of both arterial and venous cord gases- only see result for venous: pH 7.00 Fundus firm to massage, then boggy, 1,0900mcg cytotec pr given w/ good results Multiple small clots evacuated from lower uterine segment  Anesthesia:  epidural Episiotomy: None Lacerations: None Suture Repair: n/a Est. Blood Loss (mL):  700  Mom to postpartum.  Baby to Couplet care / Skin to Skin. Placenta to path Plans to bottlefeed, undecided about contraception  Marge DuncansBooker, Erman Thum Randall 03/17/2016, 1:59 AM

## 2015-08-13 ENCOUNTER — Emergency Department (HOSPITAL_COMMUNITY)
Admission: EM | Admit: 2015-08-13 | Discharge: 2015-08-13 | Disposition: A | Discharge status: Home / Self Care | Payer: Medicaid Other | Attending: Emergency Medicine | Admitting: Emergency Medicine

## 2015-08-13 ENCOUNTER — Encounter (HOSPITAL_COMMUNITY): Payer: Self-pay

## 2015-08-13 DIAGNOSIS — R197 Diarrhea, unspecified: Secondary | ICD-10-CM | POA: Insufficient documentation

## 2015-08-13 DIAGNOSIS — Z3A01 Less than 8 weeks gestation of pregnancy: Secondary | ICD-10-CM | POA: Diagnosis not present

## 2015-08-13 DIAGNOSIS — O26891 Other specified pregnancy related conditions, first trimester: Secondary | ICD-10-CM | POA: Insufficient documentation

## 2015-08-13 DIAGNOSIS — Z3490 Encounter for supervision of normal pregnancy, unspecified, unspecified trimester: Secondary | ICD-10-CM

## 2015-08-13 LAB — URINE MICROSCOPIC-ADD ON: WBC UA: NONE SEEN WBC/hpf (ref 0–5)

## 2015-08-13 LAB — COMPREHENSIVE METABOLIC PANEL
ALBUMIN: 3.9 g/dL (ref 3.5–5.0)
ALT: 16 U/L (ref 14–54)
ANION GAP: 5 (ref 5–15)
AST: 19 U/L (ref 15–41)
Alkaline Phosphatase: 59 U/L (ref 38–126)
BUN: 10 mg/dL (ref 6–20)
CHLORIDE: 105 mmol/L (ref 101–111)
CO2: 23 mmol/L (ref 22–32)
Calcium: 8.6 mg/dL — ABNORMAL LOW (ref 8.9–10.3)
Creatinine, Ser: 0.73 mg/dL (ref 0.44–1.00)
GFR calc Af Amer: >60 mL/min (ref >60)
GFR calc non Af Amer: >60 mL/min (ref >60)
GLUCOSE: 89 mg/dL (ref 65–99)
POTASSIUM: 3.7 mmol/L (ref 3.5–5.1)
SODIUM: 133 mmol/L — AB (ref 135–145)
Total Bilirubin: 0.5 mg/dL (ref 0.3–1.2)
Total Protein: 7.4 g/dL (ref 6.5–8.1)

## 2015-08-13 LAB — URINALYSIS, ROUTINE W REFLEX MICROSCOPIC
BILIRUBIN URINE: NEGATIVE
Glucose, UA: NEGATIVE mg/dL
KETONES UR: 15 mg/dL — AB
Leukocytes, UA: NEGATIVE
NITRITE: NEGATIVE
Protein, ur: NEGATIVE mg/dL
Specific Gravity, Urine: 1.02 (ref 1.005–1.030)
pH: 6 (ref 5.0–8.0)

## 2015-08-13 LAB — CBC WITH DIFFERENTIAL/PLATELET
BASOS ABS: 0 10*3/uL (ref 0.0–0.1)
BASOS PCT: 0 %
EOS ABS: 0 10*3/uL (ref 0.0–0.7)
Eosinophils Relative: 1 %
HCT: 36.9 % (ref 36.0–46.0)
HEMOGLOBIN: 11.6 g/dL — AB (ref 12.0–15.0)
Lymphocytes Relative: 29 %
Lymphs Abs: 2 10*3/uL (ref 0.7–4.0)
MCH: 21.9 pg — ABNORMAL LOW (ref 26.0–34.0)
MCHC: 31.4 g/dL (ref 30.0–36.0)
MCV: 69.8 fL — ABNORMAL LOW (ref 78.0–100.0)
MONO ABS: 0.4 10*3/uL (ref 0.1–1.0)
MONOS PCT: 6 %
NEUTROS PCT: 65 %
Neutro Abs: 4.6 10*3/uL (ref 1.7–7.7)
Platelets: 257 10*3/uL (ref 150–400)
RBC: 5.29 MIL/uL — ABNORMAL HIGH (ref 3.87–5.11)
RDW: 14.8 % (ref 11.5–15.5)
WBC: 7.1 10*3/uL (ref 4.0–10.5)

## 2015-08-13 LAB — PREGNANCY, URINE: Preg Test, Ur: POSITIVE — AB

## 2015-08-13 NOTE — ED Notes (Signed)
Pt reports nausea and generalized weakness, and diarrhea since yesterday.  Also reports generalized abd pain.

## 2015-08-13 NOTE — ED Notes (Signed)
Pt believes she may have a virus-c/o abd pain with nausea and diarrhea

## 2015-08-13 NOTE — ED Provider Notes (Signed)
CSN: [id number]     Arrival date & time 08/13/15  1110 History   First MD Initiated Contact with Patient 08/13/15 1159     Chief Complaint  Patient presents with  . Abdominal Pain     (Consider location/radiation/quality/duration/timing/severity/associated sxs/prior Treatment) Patient is a 35 y.o. female presenting with weakness. The history is provided by the patient (Patient states she just feels a little weak recently).  Weakness This is a new problem. The current episode started more than 2 days ago. The problem occurs constantly. The problem has not changed since onset.Pertinent negatives include no chest pain, no abdominal pain, no headaches and no shortness of breath. Nothing aggravates the symptoms. Nothing relieves the symptoms.    History reviewed. No pertinent past medical history. History reviewed. No pertinent past surgical history. Family History  Problem Relation Age of Onset  . Cancer Mother    Social History  Substance Use Topics  . Smoking status: Never Smoker   . Smokeless tobacco: Never Used  . Alcohol Use: No   OB History    Gravida Para Term Preterm AB TAB SAB Ectopic Multiple Living   2 2 2       2      Review of Systems  Constitutional: Negative for appetite change and fatigue.  HENT: Negative for congestion, ear discharge and sinus pressure.   Eyes: Negative for discharge.  Respiratory: Negative for cough and shortness of breath.   Cardiovascular: Negative for chest pain.  Gastrointestinal: Positive for diarrhea. Negative for abdominal pain.  Genitourinary: Negative for frequency and hematuria.  Musculoskeletal: Negative for back pain.  Skin: Negative for rash.  Neurological: Positive for weakness. Negative for seizures and headaches.  Psychiatric/Behavioral: Negative for hallucinations.      Allergies  Review of patient's allergies indicates no known allergies.  Home Medications   Prior to Admission medications   Medication Sig Start Date  End Date Taking? Authorizing Provider  clotrimazole-betamethasone (LOTRISONE) cream Apply 1 application topically 3 (three) times daily. 07/26/14   Historical Provider, MD  diazepam (VALIUM) 5 MG tablet Take 1 tablet (5 mg total) by mouth every 6 (six) hours as needed for anxiety or muscle spasms (spasms). Patient not taking: Reported on 07/09/2014 05/30/13   Junious SilkHannah Merrell, PA-C  Homeopathic Products (COLD RELIEF PO) Take 1-2 tablets by mouth daily as needed (FOR COLD RELIEF).    Historical Provider, MD  ibuprofen (ADVIL,MOTRIN) 200 MG tablet Take 200 mg by mouth every 6 (six) hours as needed for mild pain or moderate pain.    Historical Provider, MD  naproxen (NAPROSYN) 500 MG tablet Take 1 tablet (500 mg total) by mouth 2 (two) times daily with a meal. Patient not taking: Reported on 07/09/2014 05/30/13   Junious SilkHannah Merrell, PA-C  predniSONE (DELTASONE) 20 MG tablet Take 2 tablets (40 mg total) by mouth daily. Start 06/30/14 Patient not taking: Reported on 07/09/2014 06/29/14   Samuel JesterKathleen McManus, DO   BP 118/84 mmHg  Pulse 77  Temp(Src) 98 F (36.7 C) (Oral)  Resp 18  Ht 5\' 6"  (1.676 m)  Wt 190 lb (86.183 kg)  BMI 30.68 kg/m2  SpO2 100%  LMP 07/21/2015 Physical Exam  Constitutional: She is oriented to person, place, and time. She appears well-developed.  HENT:  Head: Normocephalic.  Eyes: Conjunctivae and EOM are normal. No scleral icterus.  Neck: Neck supple. No thyromegaly present.  Cardiovascular: Normal rate and regular rhythm.  Exam reveals no gallop and no friction rub.   No murmur heard. Pulmonary/Chest:  No stridor. She has no wheezes. She has no rales. She exhibits no tenderness.  Abdominal: She exhibits no distension. There is no tenderness. There is no rebound.  Musculoskeletal: Normal range of motion. She exhibits no edema.  Lymphadenopathy:    She has no cervical adenopathy.  Neurological: She is oriented to person, place, and time. She exhibits normal muscle tone. Coordination normal.   Skin: No rash noted. No erythema.  Psychiatric: She has a normal mood and affect. Her behavior is normal.    ED Course  Procedures (including critical care time) Labs Review Labs Reviewed  CBC WITH DIFFERENTIAL/PLATELET - Abnormal; Notable for the following:    RBC 5.29 (*)    Hemoglobin 11.6 (*)    MCV 69.8 (*)    MCH 21.9 (*)    All other components within normal limits  URINALYSIS, ROUTINE W REFLEX MICROSCOPIC (NOT AT [facility]) - Abnormal; Notable for the following:    Hgb urine dipstick TRACE (*)    Ketones, ur 15 (*)    All other components within normal limits  COMPREHENSIVE METABOLIC PANEL - Abnormal; Notable for the following:    Sodium 133 (*)    Calcium 8.6 (*)    All other components within normal limits  PREGNANCY, URINE - Abnormal; Notable for the following:    Preg Test, Ur POSITIVE (*)    All other components within normal limits  URINE MICROSCOPIC-ADD ON - Abnormal; Notable for the following:    Squamous Epithelial / LPF 0-5 (*)    Bacteria, UA FEW (*)    All other components within normal limits    Imaging Review No results found. I have personally reviewed and evaluated these images and lab results as part of my medical decision-making.   EKG Interpretation None      MDM   Final diagnoses:  Pregnancy    Patient with positive pregnancy test. Labs unremarkable. Patient may have a little stomach virus. She is told to drink plenty of fluids and follow-up her GYN M.D.    Bethann Berkshire, MD 08/13/15 1229

## 2015-08-13 NOTE — Discharge Instructions (Signed)
Follow-up with Dr. Emelda FearFerguson in a couple weeks

## 2015-08-20 ENCOUNTER — Ambulatory Visit (INDEPENDENT_AMBULATORY_CARE_PROVIDER_SITE_OTHER): Payer: Medicaid Other | Admitting: Adult Health

## 2015-08-20 ENCOUNTER — Encounter: Payer: Self-pay | Admitting: Adult Health

## 2015-08-20 VITALS — BP 120/70 | HR 74 | Ht 66.0 in | Wt 191.0 lb

## 2015-08-20 DIAGNOSIS — O3680X0 Pregnancy with inconclusive fetal viability, not applicable or unspecified: Secondary | ICD-10-CM

## 2015-08-20 DIAGNOSIS — Z3490 Encounter for supervision of normal pregnancy, unspecified, unspecified trimester: Secondary | ICD-10-CM

## 2015-08-20 DIAGNOSIS — R110 Nausea: Secondary | ICD-10-CM

## 2015-08-20 DIAGNOSIS — Z3201 Encounter for pregnancy test, result positive: Secondary | ICD-10-CM | POA: Diagnosis not present

## 2015-08-20 DIAGNOSIS — R531 Weakness: Secondary | ICD-10-CM

## 2015-08-20 HISTORY — DX: Nausea: R11.0

## 2015-08-20 HISTORY — DX: Encounter for supervision of normal pregnancy, unspecified, unspecified trimester: Z34.90

## 2015-08-20 LAB — POCT URINE PREGNANCY: Preg Test, Ur: POSITIVE — AB

## 2015-08-20 MED ORDER — PROMETHAZINE HCL 25 MG PO TABS: 25.0000 mg | ORAL_TABLET | Freq: Four times a day (QID) | ORAL | Status: DC | PRN

## 2015-08-20 MED ORDER — PRENATAL PLUS 27-1 MG PO TABS: 1.0000 | ORAL_TABLET | Freq: Every day | ORAL | Status: DC

## 2015-08-20 NOTE — Progress Notes (Signed)
Subjective:     Patient ID: Tammy Perry, female   DOB: 09/30/1980, 35 y.o.   MRN: [id number]  HPI Tammy Perry is a 35 year old black female in for UPT, had + UPT at ER, was seen for nausea and feeling weak and found out she was pregnant.Denies any bleeding or cramping and no vomiting.  Review of Systems  Patient denies any headaches, hearing loss, fatigue, blurred vision, shortness of breath, chest pain, abdominal pain, problems with bowel movements, urination, or intercourse. No joint pain or mood swings.See HPI for positives. Reviewed past medical,surgical, social and family history. Reviewed medications and allergies.     Objective:   Physical Exam BP 120/70 mmHg  Pulse 74  Ht 5\' 6"  (1.676 m)  Wt 191 lb (86.637 kg)  BMI 30.84 kg/m2  LMP 07/21/2015 UPT + about 5+5 weeks by LMP with EDD 04/19/16. Medicaid form given Skin warm and dry. Neck: mid line trachea, normal thyroid, good ROM, no lymphadenopathy noted. Lungs: clear to ausculation bilaterally. Cardiovascular: regular rate and rhythm.Abdomen soft and non tender    Assessment:     +UPT Pregnant Nausea    Plan:     Rx prenatal plus #30 take 1 daily with 11 refills Rx phenrgan 25 mg #30 take 1 every 6 hours prn with 1 refill Return in 2 weeks for dating US Review handout on first trimester Eat often

## 2015-08-20 NOTE — Patient Instructions (Signed)
First Trimester of Pregnancy The first trimester of pregnancy is from week 1 until the end of week 12 (months 1 through 3). A week after a sperm fertilizes an egg, the egg will implant on the wall of the uterus. This embryo will begin to develop into a baby. Genes from you and your partner are forming the baby. The female genes determine whether the baby is a boy or a girl. At 6-8 weeks, the eyes and face are formed, and the heartbeat can be seen on ultrasound. At the end of 12 weeks, all the baby's organs are formed.  Now that you are pregnant, you will want to do everything you can to have a healthy baby. Two of the most important things are to get good prenatal care and to follow your health care provider's instructions. Prenatal care is all the medical care you receive before the baby's birth. This care will help prevent, find, and treat any problems during the pregnancy and childbirth. BODY CHANGES Your body goes through many changes during pregnancy. The changes vary from woman to woman.   You may gain or lose a couple of pounds at first.  You may feel sick to your stomach (nauseous) and throw up (vomit). If the vomiting is uncontrollable, call your health care provider.  You may tire easily.  You may develop headaches that can be relieved by medicines approved by your health care provider.  You may urinate more often. Painful urination may mean you have a bladder infection.  You may develop heartburn as a result of your pregnancy.  You may develop constipation because certain hormones are causing the muscles that push waste through your intestines to slow down.  You may develop hemorrhoids or swollen, bulging veins (varicose veins).  Your breasts may begin to grow larger and become tender. Your nipples may stick out more, and the tissue that surrounds them (areola) may become darker.  Your gums may bleed and may be sensitive to brushing and flossing.  Dark spots or blotches (chloasma,  mask of pregnancy) may develop on your face. This will likely fade after the baby is born.  Your menstrual periods will stop.  You may have a loss of appetite.  You may develop cravings for certain kinds of food.  You may have changes in your emotions from day to day, such as being excited to be pregnant or being concerned that something may go wrong with the pregnancy and baby.  You may have more vivid and strange dreams.  You may have changes in your hair. These can include thickening of your hair, rapid growth, and changes in texture. Some women also have hair loss during or after pregnancy, or hair that feels dry or thin. Your hair will most likely return to normal after your baby is born. WHAT TO EXPECT AT YOUR PRENATAL VISITS During a routine prenatal visit:  You will be weighed to make sure you and the baby are growing normally.  Your blood pressure will be taken.  Your abdomen will be measured to track your baby's growth.  The fetal heartbeat will be listened to starting around week 10 or 12 of your pregnancy.  Test results from any previous visits will be discussed. Your health care provider may ask you:  How you are feeling.  If you are feeling the baby move.  If you have had any abnormal symptoms, such as leaking fluid, bleeding, severe headaches, or abdominal cramping.  If you are using any tobacco products,   including cigarettes, chewing tobacco, and electronic cigarettes.  If you have any questions. Other tests that may be performed during your first trimester include:  Blood tests to find your blood type and to check for the presence of any previous infections. They will also be used to check for low iron levels (anemia) and Rh antibodies. Later in the pregnancy, blood tests for diabetes will be done along with other tests if problems develop.  Urine tests to check for infections, diabetes, or protein in the urine.  An ultrasound to confirm the proper growth  and development of the baby.  An amniocentesis to check for possible genetic problems.  Fetal screens for spina bifida and Down syndrome.  You may need other tests to make sure you and the baby are doing well.  HIV (human immunodeficiency virus) testing. Routine prenatal testing includes screening for HIV, unless you choose not to have this test. HOME CARE INSTRUCTIONS  Medicines  Follow your health care provider's instructions regarding medicine use. Specific medicines may be either safe or unsafe to take during pregnancy.  Take your prenatal vitamins as directed.  If you develop constipation, try taking a stool softener if your health care provider approves. Diet  Eat regular, well-balanced meals. Choose a variety of foods, such as meat or vegetable-based protein, fish, milk and low-fat dairy products, vegetables, fruits, and whole grain breads and cereals. Your health care provider will help you determine the amount of weight gain that is right for you.  Avoid raw meat and uncooked cheese. These carry germs that can cause birth defects in the baby.  Eating four or five small meals rather than three large meals a day may help relieve nausea and vomiting. If you start to feel nauseous, eating a few soda crackers can be helpful. Drinking liquids between meals instead of during meals also seems to help nausea and vomiting.  If you develop constipation, eat more high-fiber foods, such as fresh vegetables or fruit and whole grains. Drink enough fluids to keep your urine clear or pale yellow. Activity and Exercise  Exercise only as directed by your health care provider. Exercising will help you:  Control your weight.  Stay in shape.  Be prepared for labor and delivery.  Experiencing pain or cramping in the lower abdomen or low back is a good sign that you should stop exercising. Check with your health care provider before continuing normal exercises.  Try to avoid standing for long  periods of time. Move your legs often if you must stand in one place for a long time.  Avoid heavy lifting.  Wear low-heeled shoes, and practice good posture.  You may continue to have sex unless your health care provider directs you otherwise. Relief of Pain or Discomfort  Wear a good support bra for breast tenderness.   Take warm sitz baths to soothe any pain or discomfort caused by hemorrhoids. Use hemorrhoid cream if your health care provider approves.   Rest with your legs elevated if you have leg cramps or low back pain.  If you develop varicose veins in your legs, wear support hose. Elevate your feet for 15 minutes, 3-4 times a day. Limit salt in your diet. Prenatal Care  Schedule your prenatal visits by the twelfth week of pregnancy. They are usually scheduled monthly at first, then more often in the last 2 months before delivery.  Write down your questions. Take them to your prenatal visits.  Keep all your prenatal visits as directed by your   health care provider. Safety  Wear your seat belt at all times when driving.  Make a list of emergency phone numbers, including numbers for family, friends, the hospital, and police and fire departments. General Tips  Ask your health care provider for a referral to a local prenatal education class. Begin classes no later than at the beginning of month 6 of your pregnancy.  Ask for help if you have counseling or nutritional needs during pregnancy. Your health care provider can offer advice or refer you to specialists for help with various needs.  Do not use hot tubs, steam rooms, or saunas.  Do not douche or use tampons or scented sanitary pads.  Do not cross your legs for long periods of time.  Avoid cat litter boxes and soil used by cats. These carry germs that can cause birth defects in the baby and possibly loss of the fetus by miscarriage or stillbirth.  Avoid all smoking, herbs, alcohol, and medicines not prescribed by  your health care provider. Chemicals in these affect the formation and growth of the baby.  Do not use any tobacco products, including cigarettes, chewing tobacco, and electronic cigarettes. If you need help quitting, ask your health care provider. You may receive counseling support and other resources to help you quit.  Schedule a dentist appointment. At home, brush your teeth with a soft toothbrush and be gentle when you floss. SEEK MEDICAL CARE IF:   You have dizziness.  You have mild pelvic cramps, pelvic pressure, or nagging pain in the abdominal area.  You have persistent nausea, vomiting, or diarrhea.  You have a bad smelling vaginal discharge.  You have pain with urination.  You notice increased swelling in your face, hands, legs, or ankles. SEEK IMMEDIATE MEDICAL CARE IF:   You have a fever.  You are leaking fluid from your vagina.  You have spotting or bleeding from your vagina.  You have severe abdominal cramping or pain.  You have rapid weight gain or loss.  You vomit blood or material that looks like coffee grounds.  You are exposed to [place] measles and have never had them.  You are exposed to fifth disease or chickenpox.  You develop a severe headache.  You have shortness of breath.  You have any kind of trauma, such as from a fall or a car accident.   This information is not intended to replace advice given to you by your health care provider. Make sure you discuss any questions you have with your health care provider.   Document Released: 04/06/2001 Document Revised: 05/03/2014 Document Reviewed: 02/20/2013 Elsevier Interactive Patient Education [employer]2016 Elsevier Inc. Eat often Return in 2 weeks for dating [place]

## 2015-09-03 ENCOUNTER — Ambulatory Visit (INDEPENDENT_AMBULATORY_CARE_PROVIDER_SITE_OTHER): Payer: Medicaid Other

## 2015-09-03 DIAGNOSIS — O3680X0 Pregnancy with inconclusive fetal viability, not applicable or unspecified: Secondary | ICD-10-CM

## 2015-09-03 NOTE — Progress Notes (Signed)
US 9+1 wks,single IUP w/ys,pos fht 176 bpm,normal rt ov,simple corpus luteal cyst lt ov 4.7 x 4.3 x 4.3 cm,crl 24.6 mm

## 2015-09-05 ENCOUNTER — Telehealth: Payer: Self-pay | Admitting: Obstetrics & Gynecology

## 2015-09-05 NOTE — Telephone Encounter (Signed)
Pt c/o headaches. Pt informed to take OTC Tylenol, push water. If no improvement call our office back. Pt verbalized understanding.

## 2015-09-10 ENCOUNTER — Ambulatory Visit (INDEPENDENT_AMBULATORY_CARE_PROVIDER_SITE_OTHER): Payer: Medicaid Other | Admitting: Women's Health

## 2015-09-10 ENCOUNTER — Encounter: Payer: Self-pay | Admitting: Women's Health

## 2015-09-10 VITALS — BP 132/80 | HR 84 | Wt 191.0 lb

## 2015-09-10 DIAGNOSIS — Z1389 Encounter for screening for other disorder: Secondary | ICD-10-CM

## 2015-09-10 DIAGNOSIS — Z3491 Encounter for supervision of normal pregnancy, unspecified, first trimester: Secondary | ICD-10-CM | POA: Diagnosis not present

## 2015-09-10 DIAGNOSIS — Z331 Pregnant state, incidental: Secondary | ICD-10-CM

## 2015-09-10 DIAGNOSIS — Z3490 Encounter for supervision of normal pregnancy, unspecified, unspecified trimester: Secondary | ICD-10-CM | POA: Insufficient documentation

## 2015-09-10 DIAGNOSIS — Z369 Encounter for antenatal screening, unspecified: Secondary | ICD-10-CM

## 2015-09-10 DIAGNOSIS — Z0283 Encounter for blood-alcohol and blood-drug test: Secondary | ICD-10-CM

## 2015-09-10 DIAGNOSIS — O34219 Maternal care for unspecified type scar from previous cesarean delivery: Secondary | ICD-10-CM

## 2015-09-10 LAB — POCT URINALYSIS DIPSTICK
Blood, UA: NEGATIVE
GLUCOSE UA: NEGATIVE
Ketones, UA: NEGATIVE
LEUKOCYTES UA: NEGATIVE
Nitrite, UA: NEGATIVE
Protein, UA: NEGATIVE

## 2015-09-10 NOTE — Progress Notes (Signed)
  Subjective:  Tammy Perry is a 35 y.o. 93P2002 African American female at 5916w1d by 9wk u/s, being seen today for her first obstetrical visit.  Her obstetrical history is significant for (1) prev term c/s for breech w/ 1st pregnancy, then (2) successful VBAC 2nd pregnancy- wants another VBAC.  Pregnancy history fully reviewed.  Patient reports no complaints. Denies vb, cramping, uti s/s, abnormal/malodorous vag d/c, or vulvovaginal itching/irritation.  BP 132/80 mmHg  Pulse 84  Wt 191 lb (86.637 kg)  LMP 07/21/2015 (Approximate)  HISTORY: OB History  Gravida Para Term Preterm AB SAB TAB Ectopic Multiple Living  3 2 2       2     # Outcome Date GA Lbr Len/2nd Weight Sex Delivery Anes PTL Lv  3 Current           2 Term 03/11/07 5235w0d  7 lb 5 oz (3.317 kg) M Vag-Spont  N Y  1 Term 11/20/98 3566w5d  7 lb 9 oz (3.43 kg) M CS-Unspec  N Y     Past Medical History  Diagnosis Date  . Pregnant 08/20/2015  . Nausea 08/20/2015  . Medical history non-contributory    Past Surgical History  Procedure Laterality Date  . Cesarean section     Family History  Problem Relation Age of Onset  . Cancer Mother     lung  . Hypertension Mother   . Hyperlipidemia Mother   . Asthma Son   . Diabetes Maternal Uncle     Exam   System:     General: Well developed & nourished, no acute distress   Skin: Warm & dry, normal coloration and turgor, no rashes   Neurologic: Alert & oriented, normal mood   Cardiovascular: Regular rate & rhythm   Respiratory: Effort & rate normal, LCTAB, acyanotic   Abdomen: Soft, non tender   Extremities: normal strength, tone  Thin prep pap smear neg 2016 @ [facility] high risk HPV cotesting FHR: 165 via informal u/s (unable to hear w/ doppler)   Assessment:   Pregnancy: [id number] Patient Active Problem List   Diagnosis Date Noted  . Supervision of normal pregnancy 09/10/2015    Priority: High  . Previous cesarean delivery affecting pregnancy, antepartum 09/10/2015   . Hx successful VBAC (vaginal birth after cesarean), currently pregnant 09/10/2015  . Nausea 08/20/2015    2616w1d G3P2002 New OB visit Prev c/s Prev VBAC  Plan:  Initial labs drawn Continue prenatal vitamins Problem list reviewed and updated Reviewed n/v relief measures and warning s/s to report Reviewed recommended weight gain based on pre-gravid BMI Encouraged well-balanced diet Genetic Screening discussed Integrated Screen: declined Cystic fibrosis screening discussed declined Ultrasound discussed; fetal survey: requested Follow up in 4 weeks for visit [facility] completed Discussed risks/benefits VBAC, gave consent to take home and review  Marge DuncansBooker, Shevon Sian Randall CNM, [facility]NP[facility] 09/10/2015 11:30 AM

## 2015-09-10 NOTE — Patient Instructions (Signed)

## 2015-09-11 LAB — HIV ANTIBODY (ROUTINE TESTING W REFLEX): HIV SCREEN 4TH GENERATION: NONREACTIVE

## 2015-09-11 LAB — URINALYSIS, ROUTINE W REFLEX MICROSCOPIC
Bilirubin, UA: NEGATIVE
GLUCOSE, UA: NEGATIVE
KETONES UA: NEGATIVE
LEUKOCYTES UA: NEGATIVE
NITRITE UA: NEGATIVE
Protein, UA: NEGATIVE
RBC, UA: NEGATIVE
SPEC GRAV UA: 1.02 (ref 1.005–1.030)
Urobilinogen, Ur: 0.2 mg/dL (ref 0.2–1.0)
pH, UA: 7.5 (ref 5.0–7.5)

## 2015-09-11 LAB — PMP SCREEN PROFILE (10S), URINE
Amphetamine Screen, Ur: NEGATIVE ng/mL
BARBITURATE SCRN UR: NEGATIVE ng/mL
Benzodiazepine Screen, Urine: NEGATIVE ng/mL
CREATININE(CRT), U: 207.2 mg/dL (ref 20.0–300.0)
Cannabinoids Ur Ql Scn: NEGATIVE ng/mL
Cocaine(Metab.)Screen, Urine: NEGATIVE ng/mL
METHADONE SCREEN, URINE: NEGATIVE ng/mL
OPIATE SCRN UR: NEGATIVE ng/mL
Oxycodone+Oxymorphone Ur Ql Scn: NEGATIVE ng/mL
PCP Scrn, Ur: NEGATIVE ng/mL
PROPOXYPHENE SCREEN: NEGATIVE ng/mL
Ph of Urine: 7.3 (ref 4.5–8.9)

## 2015-09-11 LAB — CBC
HEMATOCRIT: 37.9 % (ref 34.0–46.6)
Hemoglobin: 11.6 g/dL (ref 11.1–15.9)
MCH: 21.3 pg — ABNORMAL LOW (ref 26.6–33.0)
MCHC: 30.6 g/dL — AB (ref 31.5–35.7)
MCV: 70 fL — ABNORMAL LOW (ref 79–97)
PLATELETS: 273 10*3/uL (ref 150–379)
RBC: 5.45 x10E6/uL — ABNORMAL HIGH (ref 3.77–5.28)
RDW: 15.5 % — AB (ref 12.3–15.4)
WBC: 7.1 10*3/uL (ref 3.4–10.8)

## 2015-09-11 LAB — HEPATITIS B SURFACE ANTIGEN: HEP B S AG: NEGATIVE

## 2015-09-11 LAB — SICKLE CELL SCREEN: Sickle Cell Screen: NEGATIVE

## 2015-09-11 LAB — RUBELLA SCREEN: Rubella Antibodies, IGG: 1.63 index (ref >0.99)

## 2015-09-11 LAB — URINE CULTURE: ORGANISM ID, BACTERIA: NO GROWTH

## 2015-09-11 LAB — ABO/RH: Rh Factor: POSITIVE

## 2015-09-11 LAB — VARICELLA ZOSTER ANTIBODY, IGG: VARICELLA: 777 {index} (ref >165)

## 2015-09-11 LAB — ANTIBODY SCREEN: Antibody Screen: NEGATIVE

## 2015-09-11 LAB — RPR: RPR: NONREACTIVE

## 2015-09-12 LAB — GC/CHLAMYDIA PROBE AMP
CHLAMYDIA, DNA PROBE: NEGATIVE
NEISSERIA GONORRHOEAE BY PCR: NEGATIVE

## 2015-10-08 ENCOUNTER — Ambulatory Visit (INDEPENDENT_AMBULATORY_CARE_PROVIDER_SITE_OTHER): Payer: Medicaid Other | Admitting: Women's Health

## 2015-10-08 ENCOUNTER — Encounter: Payer: Self-pay | Admitting: Women's Health

## 2015-10-08 VITALS — BP 130/76 | HR 92 | Wt 191.0 lb

## 2015-10-08 DIAGNOSIS — Z1389 Encounter for screening for other disorder: Secondary | ICD-10-CM

## 2015-10-08 DIAGNOSIS — Z363 Encounter for antenatal screening for malformations: Secondary | ICD-10-CM

## 2015-10-08 DIAGNOSIS — Z3492 Encounter for supervision of normal pregnancy, unspecified, second trimester: Secondary | ICD-10-CM

## 2015-10-08 DIAGNOSIS — O34219 Maternal care for unspecified type scar from previous cesarean delivery: Secondary | ICD-10-CM

## 2015-10-08 DIAGNOSIS — Z331 Pregnant state, incidental: Secondary | ICD-10-CM

## 2015-10-08 LAB — POCT URINALYSIS DIPSTICK
Glucose, UA: NEGATIVE
KETONES UA: NEGATIVE
Leukocytes, UA: NEGATIVE
Nitrite, UA: NEGATIVE
PROTEIN UA: NEGATIVE
RBC UA: NEGATIVE

## 2015-10-08 NOTE — Patient Instructions (Signed)
For Headaches:   Stay well hydrated, drink enough water so that your urine is clear, sometimes if you are dehydrated you can get headaches  Eat small frequent meals and snacks, sometimes if you are hungry you can get headaches  Sometimes you get headaches during pregnancy from the pregnancy hormones  You can try tylenol (1-2 regular strength 325mg or 1-2 extra strength 500mg) as directed on the box. The least amount of medication that works is best.   Cool compresses (cool wet washcloth or ice pack) to area of head that is hurting  You can also try drinking a caffeinated drink to see if this will help  If not helping, try below:  For Prevention of Headaches/Migraines:  CoQ10 100mg three times daily  Vitamin B2 400mg daily  Magnesium Oxide 400-600mg daily  If You Get a Bad Headache/Migraine:  Benadryl 25mg   Magnesium Oxide  1 large Gatorade  2 extra strength Tylenol (1,000mg total)  1 cup coffee or Coke  If this doesn't help please call us @ [phone]  Second Trimester of Pregnancy The second trimester is from week 13 through week 28, months 4 through 6. The second trimester is often a time when you feel your best. Your body has also adjusted to being pregnant, and you begin to feel better physically. Usually, morning sickness has lessened or quit completely, you may have more energy, and you may have an increase in appetite. The second trimester is also a time when the fetus is growing rapidly. At the end of the sixth month, the fetus is about 9 inches long and weighs about 1 pounds. You will likely begin to feel the baby move (quickening) between 18 and 20 weeks of the pregnancy. BODY CHANGES Your body goes through many changes during pregnancy. The changes vary from woman to woman.   Your weight will continue to increase. You will notice your lower abdomen bulging out.  You may begin to get stretch marks on your hips, abdomen, and breasts.  You may develop  headaches that can be relieved by medicines approved by your health care provider.  You may urinate more often because the fetus is pressing on your bladder.  You may develop or continue to have heartburn as a result of your pregnancy.  You may develop constipation because certain hormones are causing the muscles that push waste through your intestines to slow down.  You may develop hemorrhoids or swollen, bulging veins (varicose veins).  You may have back pain because of the weight gain and pregnancy hormones relaxing your joints between the bones in your pelvis and as a result of a shift in weight and the muscles that support your balance.  Your breasts will continue to grow and be tender.  Your gums may bleed and may be sensitive to brushing and flossing.  Dark spots or blotches (chloasma, mask of pregnancy) may develop on your face. This will likely fade after the baby is born.  A dark line from your belly button to the pubic area (linea nigra) may appear. This will likely fade after the baby is born.  You may have changes in your hair. These can include thickening of your hair, rapid growth, and changes in texture. Some women also have hair loss during or after pregnancy, or hair that feels dry or thin. Your hair will most likely return to normal after your baby is born. WHAT TO EXPECT AT YOUR PRENATAL VISITS During a routine prenatal visit:  You will be   weighed to make sure you and the fetus are growing normally.  Your blood pressure will be taken.  Your abdomen will be measured to track your baby's growth.  The fetal heartbeat will be listened to.  Any test results from the previous visit will be discussed. Your health care provider may ask you:  How you are feeling.  If you are feeling the baby move.  If you have had any abnormal symptoms, such as leaking fluid, bleeding, severe headaches, or abdominal cramping.  If you are using any tobacco products, including  cigarettes, chewing tobacco, and electronic cigarettes.  If you have any questions. Other tests that may be performed during your second trimester include:  Blood tests that check for:  Low iron levels (anemia).  Gestational diabetes (between 24 and 28 weeks).  Rh antibodies.  Urine tests to check for infections, diabetes, or protein in the urine.  An ultrasound to confirm the proper growth and development of the baby.  An amniocentesis to check for possible genetic problems.  Fetal screens for spina bifida and Down syndrome.  HIV (human immunodeficiency virus) testing. Routine prenatal testing includes screening for HIV, unless you choose not to have this test. HOME CARE INSTRUCTIONS   Avoid all smoking, herbs, alcohol, and unprescribed drugs. These chemicals affect the formation and growth of the baby.  Do not use any tobacco products, including cigarettes, chewing tobacco, and electronic cigarettes. If you need help quitting, ask your health care provider. You may receive counseling support and other resources to help you quit.  Follow your health care provider's instructions regarding medicine use. There are medicines that are either safe or unsafe to take during pregnancy.  Exercise only as directed by your health care provider. Experiencing uterine cramps is a good sign to stop exercising.  Continue to eat regular, healthy meals.  Wear a good support bra for breast tenderness.  Do not use hot tubs, steam rooms, or saunas.  Wear your seat belt at all times when driving.  Avoid raw meat, uncooked cheese, cat litter boxes, and soil used by cats. These carry germs that can cause birth defects in the baby.  Take your prenatal vitamins.  Take 1500-2000 mg of calcium daily starting at the 20th week of pregnancy until you deliver your baby.  Try taking a stool softener (if your health care provider approves) if you develop constipation. Eat more high-fiber foods, such as  fresh vegetables or fruit and whole grains. Drink plenty of fluids to keep your urine clear or pale yellow.  Take warm sitz baths to soothe any pain or discomfort caused by hemorrhoids. Use hemorrhoid cream if your health care provider approves.  If you develop varicose veins, wear support hose. Elevate your feet for 15 minutes, 3-4 times a day. Limit salt in your diet.  Avoid heavy lifting, wear low heel shoes, and practice good posture.  Rest with your legs elevated if you have leg cramps or low back pain.  Visit your dentist if you have not gone yet during your pregnancy. Use a soft toothbrush to brush your teeth and be gentle when you floss.  A sexual relationship may be continued unless your health care provider directs you otherwise.  Continue to go to all your prenatal visits as directed by your health care provider. SEEK MEDICAL CARE IF:   You have dizziness.  You have mild pelvic cramps, pelvic pressure, or nagging pain in the abdominal area.  You have persistent nausea, vomiting, or diarrhea.    You have a bad smelling vaginal discharge.  You have pain with urination. SEEK IMMEDIATE MEDICAL CARE IF:   You have a fever.  You are leaking fluid from your vagina.  You have spotting or bleeding from your vagina.  You have severe abdominal cramping or pain.  You have rapid weight gain or loss.  You have shortness of breath with chest pain.  You notice sudden or extreme swelling of your face, hands, ankles, feet, or legs.  You have not felt your baby move in over an hour.  You have severe headaches that do not go away with medicine.  You have vision changes.   This information is not intended to replace advice given to you by your health care provider. Make sure you discuss any questions you have with your health care provider.   Document Released: 04/06/2001 Document Revised: 05/03/2014 Document Reviewed: 06/13/2012 Elsevier Interactive Patient Education 2016  Elsevier Inc.    

## 2015-10-08 NOTE — Progress Notes (Signed)
Low-risk OB appointment G3P2002 8043w1d Estimated Date of Delivery: 04/06/16 BP 130/76 mmHg  Pulse 92  Wt 191 lb (86.637 kg)  LMP 07/21/2015 (Approximate)  BP, weight, and urine reviewed.  Refer to obstetrical flow sheet for FH & FHR.  No fm yet. Denies cramping, lof, vb, or uti s/s. Occ headaches- gave printed prevention/relief measures. Wants VBAC, reviewed risks/benefits, consent signed today Unable to get fhr w/ doppler, informal u/s by amber 169bpm Reviewed warning s/s to report. Plan:  Continue routine obstetrical care  F/U in 4wks for OB appointment and anatomy u/s Declines genetic screening

## 2015-11-05 ENCOUNTER — Ambulatory Visit (INDEPENDENT_AMBULATORY_CARE_PROVIDER_SITE_OTHER): Payer: Medicaid Other | Admitting: Advanced Practice Midwife

## 2015-11-05 ENCOUNTER — Ambulatory Visit (INDEPENDENT_AMBULATORY_CARE_PROVIDER_SITE_OTHER): Payer: Medicaid Other

## 2015-11-05 VITALS — BP 110/80 | HR 60 | Wt 195.0 lb

## 2015-11-05 DIAGNOSIS — Z363 Encounter for antenatal screening for malformations: Secondary | ICD-10-CM

## 2015-11-05 DIAGNOSIS — Z3A18 18 weeks gestation of pregnancy: Secondary | ICD-10-CM | POA: Diagnosis not present

## 2015-11-05 DIAGNOSIS — Z3492 Encounter for supervision of normal pregnancy, unspecified, second trimester: Secondary | ICD-10-CM

## 2015-11-05 DIAGNOSIS — Z331 Pregnant state, incidental: Secondary | ICD-10-CM

## 2015-11-05 DIAGNOSIS — Z36 Encounter for antenatal screening of mother: Secondary | ICD-10-CM

## 2015-11-05 DIAGNOSIS — Z1389 Encounter for screening for other disorder: Secondary | ICD-10-CM

## 2015-11-05 DIAGNOSIS — Z3482 Encounter for supervision of other normal pregnancy, second trimester: Secondary | ICD-10-CM

## 2015-11-05 LAB — POCT URINALYSIS DIPSTICK
Blood, UA: NEGATIVE
GLUCOSE UA: NEGATIVE
Leukocytes, UA: NEGATIVE
Nitrite, UA: NEGATIVE

## 2015-11-05 NOTE — Progress Notes (Signed)
US 18+1 wks,cephalic,ant pl gr 0,normal ov's bilat,cx 4.1 cm,svp 4.5 cm,fhr 152 bpm,efw 235 g,measurements c/w dates,anatomy complete,no obvious abnormalities seen

## 2015-11-05 NOTE — Progress Notes (Signed)
[id number] 132w1d Estimated Date of Delivery: 04/06/16  Blood pressure 110/80, pulse 60, weight 195 lb (88.451 kg), last menstrual period 07/21/2015.   BP weight and urine results all reviewed and noted.  Please refer to the obstetrical flow sheet for the fundal height and fetal heart rate documentation: US 18+1 wks,cephalic,ant pl gr 0,normal ov's bilat,cx 4.1 cm,svp 4.5 cm,fhr 152 bpm,efw 235 g,measurements c/w dates,anatomy complete,no obvious abnormalities seen         Patient reports good fetal movement, denies any bleeding and no rupture of membranes symptoms or regular contractions. Patient is without complaints. All questions were answered.  Orders Placed This Encounter  Procedures  . POCT urinalysis dipstick    Plan:  Continued routine obstetrical care,   Return in about 4 weeks (around 12/03/2015) for LROB.

## 2015-11-17 ENCOUNTER — Telehealth: Payer: Self-pay | Admitting: Women's Health

## 2015-11-17 NOTE — Telephone Encounter (Signed)
Pt states she has been irritated X 2 days and believes she has a yeast infection, advised pt to try Monistat 7 OTC and if no improvement to call us back and we will get her in for evaluation.  Pt verbalized understanding.

## 2015-12-03 ENCOUNTER — Ambulatory Visit (INDEPENDENT_AMBULATORY_CARE_PROVIDER_SITE_OTHER): Payer: Medicaid Other | Admitting: Advanced Practice Midwife

## 2015-12-03 ENCOUNTER — Encounter: Payer: Self-pay | Admitting: Advanced Practice Midwife

## 2015-12-03 VITALS — BP 110/70 | HR 74 | Wt 201.4 lb

## 2015-12-03 DIAGNOSIS — R809 Proteinuria, unspecified: Secondary | ICD-10-CM

## 2015-12-03 DIAGNOSIS — Z1389 Encounter for screening for other disorder: Secondary | ICD-10-CM

## 2015-12-03 DIAGNOSIS — Z3492 Encounter for supervision of normal pregnancy, unspecified, second trimester: Secondary | ICD-10-CM

## 2015-12-03 DIAGNOSIS — Z331 Pregnant state, incidental: Secondary | ICD-10-CM

## 2015-12-03 LAB — POCT URINALYSIS DIPSTICK
Glucose, UA: NEGATIVE
KETONES UA: NEGATIVE
Leukocytes, UA: NEGATIVE
NITRITE UA: NEGATIVE
Protein, UA: 1
RBC UA: NEGATIVE

## 2015-12-03 NOTE — Progress Notes (Signed)
[id number] 7454w1d Estimated Date of Delivery: 04/06/16  Blood pressure 110/70, pulse 74, weight 91.4 kg (201 lb 6.4 oz), last menstrual period 07/21/2015.   BP weight and urine results all reviewed and noted. No UTI sx.  Used monistat 2 weeks ago, sx better/gone.  Please refer to the obstetrical flow sheet for the fundal height and fetal heart rate documentation:  Patient reports good fetal movement, denies any bleeding and no rupture of membranes symptoms or regular contractions. Patient is without complaints. All questions were answered.  Orders Placed This Encounter  Procedures  . Urine culture  . POCT urinalysis dipstick    Plan:  Continued routine obstetrical care,   Return in about 3 weeks (around 12/24/2015) for LROB.

## 2015-12-04 LAB — URINE CULTURE

## 2015-12-24 ENCOUNTER — Encounter: Payer: Self-pay | Admitting: Advanced Practice Midwife

## 2015-12-24 ENCOUNTER — Ambulatory Visit (INDEPENDENT_AMBULATORY_CARE_PROVIDER_SITE_OTHER): Payer: Medicaid Other | Admitting: Advanced Practice Midwife

## 2015-12-24 VITALS — BP 110/60 | HR 92 | Wt 202.5 lb

## 2015-12-24 DIAGNOSIS — Z331 Pregnant state, incidental: Secondary | ICD-10-CM

## 2015-12-24 DIAGNOSIS — Z3482 Encounter for supervision of other normal pregnancy, second trimester: Secondary | ICD-10-CM

## 2015-12-24 DIAGNOSIS — Z1389 Encounter for screening for other disorder: Secondary | ICD-10-CM

## 2015-12-24 DIAGNOSIS — Z3A26 26 weeks gestation of pregnancy: Secondary | ICD-10-CM

## 2015-12-24 DIAGNOSIS — Z3492 Encounter for supervision of normal pregnancy, unspecified, second trimester: Secondary | ICD-10-CM

## 2015-12-24 LAB — POCT URINALYSIS DIPSTICK
Glucose, UA: NEGATIVE
KETONES UA: NEGATIVE
LEUKOCYTES UA: NEGATIVE
Nitrite, UA: NEGATIVE
PROTEIN UA: NEGATIVE

## 2015-12-24 NOTE — Patient Instructions (Signed)

## 2015-12-24 NOTE — Progress Notes (Signed)
[id number] 2265w1d Estimated Date of Delivery: 04/06/16  Blood pressure 110/60, pulse 92, weight 202 lb 8 oz (91.9 kg), last menstrual period 07/21/2015.   BP weight and urine results all reviewed and noted.  Please refer to the obstetrical flow sheet for the fundal height and fetal heart rate documentation:  Patient reports good fetal movement, denies any bleeding and no rupture of membranes symptoms or regular contractions. Patient is without complaints. All questions were answered.  Orders Placed This Encounter  Procedures  . POCT Urinalysis Dipstick    Plan:  Continued routine obstetrical care,   Return in about 2 weeks (around 01/07/2016) for PN2/LROB.

## 2016-01-07 ENCOUNTER — Other Ambulatory Visit: Payer: Medicaid Other

## 2016-01-07 ENCOUNTER — Ambulatory Visit (INDEPENDENT_AMBULATORY_CARE_PROVIDER_SITE_OTHER): Payer: Medicaid Other | Admitting: Advanced Practice Midwife

## 2016-01-07 ENCOUNTER — Encounter: Payer: Self-pay | Admitting: Advanced Practice Midwife

## 2016-01-07 VITALS — BP 120/80 | HR 74 | Wt 200.0 lb

## 2016-01-07 DIAGNOSIS — Z331 Pregnant state, incidental: Secondary | ICD-10-CM

## 2016-01-07 DIAGNOSIS — Z131 Encounter for screening for diabetes mellitus: Secondary | ICD-10-CM

## 2016-01-07 DIAGNOSIS — Z369 Encounter for antenatal screening, unspecified: Secondary | ICD-10-CM

## 2016-01-07 DIAGNOSIS — Z3483 Encounter for supervision of other normal pregnancy, third trimester: Secondary | ICD-10-CM

## 2016-01-07 DIAGNOSIS — Z3A28 28 weeks gestation of pregnancy: Secondary | ICD-10-CM

## 2016-01-07 DIAGNOSIS — Z1389 Encounter for screening for other disorder: Secondary | ICD-10-CM

## 2016-01-07 DIAGNOSIS — Z3492 Encounter for supervision of normal pregnancy, unspecified, second trimester: Secondary | ICD-10-CM

## 2016-01-07 LAB — POCT URINALYSIS DIPSTICK
Blood, UA: NEGATIVE
GLUCOSE UA: NEGATIVE
KETONES UA: NEGATIVE
LEUKOCYTES UA: NEGATIVE
Nitrite, UA: NEGATIVE
Protein, UA: 1

## 2016-01-07 NOTE — Patient Instructions (Signed)

## 2016-01-07 NOTE — Progress Notes (Signed)
[id number] 5854w1d Estimated Date of Delivery: 04/06/16  Blood pressure 120/80, pulse 74, weight 200 lb (90.7 kg), last menstrual period 07/21/2015.   BP weight and urine results all reviewed and noted.  Please refer to the obstetrical flow sheet for the fundal height and fetal heart rate documentation:  Patient reports good fetal movement, denies any bleeding and no rupture of membranes symptoms or regular contractions. Patient is without complaints. All questions were answered.  Orders Placed This Encounter  Procedures  . POCT urinalysis dipstick    Plan:  Continued routine obstetrical care, PN2 today  Return in about 3 weeks (around 01/28/2016) for LROB.

## 2016-01-08 LAB — CBC
HEMATOCRIT: 34.2 % (ref 34.0–46.6)
HEMOGLOBIN: 11 g/dL — AB (ref 11.1–15.9)
MCH: 22.3 pg — AB (ref 26.6–33.0)
MCHC: 32.2 g/dL (ref 31.5–35.7)
MCV: 69 fL — ABNORMAL LOW (ref 79–97)
Platelets: 177 10*3/uL (ref 150–379)
RBC: 4.94 x10E6/uL (ref 3.77–5.28)
RDW: 14.9 % (ref 12.3–15.4)
WBC: 6.8 10*3/uL (ref 3.4–10.8)

## 2016-01-08 LAB — GLUCOSE TOLERANCE, 2 HOURS W/ 1HR
GLUCOSE, 1 HOUR: 110 mg/dL (ref 65–179)
GLUCOSE, 2 HOUR: 80 mg/dL (ref 65–152)
GLUCOSE, FASTING: 82 mg/dL (ref 65–91)

## 2016-01-08 LAB — RPR: RPR: NONREACTIVE

## 2016-01-08 LAB — HIV ANTIBODY (ROUTINE TESTING W REFLEX): HIV SCREEN 4TH GENERATION: NONREACTIVE

## 2016-01-08 LAB — ANTIBODY SCREEN: Antibody Screen: NEGATIVE

## 2016-01-28 ENCOUNTER — Encounter: Payer: Self-pay | Admitting: Advanced Practice Midwife

## 2016-01-28 ENCOUNTER — Ambulatory Visit (INDEPENDENT_AMBULATORY_CARE_PROVIDER_SITE_OTHER): Payer: Medicaid Other | Admitting: Advanced Practice Midwife

## 2016-01-28 VITALS — BP 100/70 | HR 88 | Wt 203.0 lb

## 2016-01-28 DIAGNOSIS — Z331 Pregnant state, incidental: Secondary | ICD-10-CM

## 2016-01-28 DIAGNOSIS — Z3483 Encounter for supervision of other normal pregnancy, third trimester: Secondary | ICD-10-CM

## 2016-01-28 DIAGNOSIS — Z1389 Encounter for screening for other disorder: Secondary | ICD-10-CM

## 2016-01-28 LAB — POCT URINALYSIS DIPSTICK
Blood, UA: NEGATIVE
Glucose, UA: NEGATIVE
KETONES UA: NEGATIVE
LEUKOCYTES UA: NEGATIVE
Nitrite, UA: NEGATIVE

## 2016-01-28 NOTE — Progress Notes (Signed)
[id number] 4676w1d Estimated Date of Delivery: 04/06/16  Blood pressure 100/70, pulse 88, weight 203 lb (92.1 kg), last menstrual period 07/21/2015.   BP weight and urine results all reviewed and noted.  Please refer to the obstetrical flow sheet for the fundal height and fetal heart rate documentation:  Patient reports good fetal movement, denies any bleeding and no rupture of membranes symptoms or regular contractions. Patient is without complaints. All questions were answered.  Orders Placed This Encounter  Procedures  . POCT urinalysis dipstick    Plan:  Continued routine obstetrical care,   Return in about 2 weeks (around 02/11/2016) for LROB.

## 2016-01-28 NOTE — Patient Instructions (Signed)

## 2016-02-08 ENCOUNTER — Emergency Department (HOSPITAL_COMMUNITY)
Admission: EM | Admit: 2016-02-08 | Discharge: 2016-02-08 | Disposition: A | Discharge status: Home / Self Care | Payer: Medicaid Other | Attending: Emergency Medicine | Admitting: Emergency Medicine

## 2016-02-08 ENCOUNTER — Encounter (HOSPITAL_COMMUNITY): Payer: Self-pay | Admitting: *Deleted

## 2016-02-08 DIAGNOSIS — R1030 Lower abdominal pain, unspecified: Secondary | ICD-10-CM | POA: Insufficient documentation

## 2016-02-08 DIAGNOSIS — Z3490 Encounter for supervision of normal pregnancy, unspecified, unspecified trimester: Secondary | ICD-10-CM

## 2016-02-08 DIAGNOSIS — Z79899 Other long term (current) drug therapy: Secondary | ICD-10-CM | POA: Insufficient documentation

## 2016-02-08 DIAGNOSIS — Z3A32 32 weeks gestation of pregnancy: Secondary | ICD-10-CM | POA: Diagnosis not present

## 2016-02-08 DIAGNOSIS — O26893 Other specified pregnancy related conditions, third trimester: Secondary | ICD-10-CM | POA: Insufficient documentation

## 2016-02-08 DIAGNOSIS — Z3483 Encounter for supervision of other normal pregnancy, third trimester: Secondary | ICD-10-CM

## 2016-02-08 LAB — BASIC METABOLIC PANEL
Anion gap: 3 — ABNORMAL LOW (ref 5–15)
BUN: 9 mg/dL (ref 6–20)
CHLORIDE: 108 mmol/L (ref 101–111)
CO2: 23 mmol/L (ref 22–32)
CREATININE: 0.68 mg/dL (ref 0.44–1.00)
Calcium: 8.9 mg/dL (ref 8.9–10.3)
GFR calc Af Amer: >60 mL/min (ref >60)
GFR calc non Af Amer: >60 mL/min (ref >60)
GLUCOSE: 75 mg/dL (ref 65–99)
POTASSIUM: 3.8 mmol/L (ref 3.5–5.1)
SODIUM: 134 mmol/L — AB (ref 135–145)

## 2016-02-08 LAB — CBC WITH DIFFERENTIAL/PLATELET
Basophils Absolute: 0 10*3/uL (ref 0.0–0.1)
Basophils Relative: 0 %
EOS ABS: 0.1 10*3/uL (ref 0.0–0.7)
EOS PCT: 1 %
HCT: 36.3 % (ref 36.0–46.0)
HEMOGLOBIN: 11.5 g/dL — AB (ref 12.0–15.0)
LYMPHS ABS: 1.8 10*3/uL (ref 0.7–4.0)
LYMPHS PCT: 25 %
MCH: 22.8 pg — AB (ref 26.0–34.0)
MCHC: 31.7 g/dL (ref 30.0–36.0)
MCV: 72 fL — AB (ref 78.0–100.0)
MONOS PCT: 13 %
Monocytes Absolute: 1 10*3/uL (ref 0.1–1.0)
NEUTROS PCT: 62 %
Neutro Abs: 4.6 10*3/uL (ref 1.7–7.7)
Platelets: 147 10*3/uL — ABNORMAL LOW (ref 150–400)
RBC: 5.04 MIL/uL (ref 3.87–5.11)
RDW: 15.1 % (ref 11.5–15.5)
WBC: 7.5 10*3/uL (ref 4.0–10.5)

## 2016-02-08 LAB — URINALYSIS, ROUTINE W REFLEX MICROSCOPIC
BILIRUBIN URINE: NEGATIVE
GLUCOSE, UA: NEGATIVE mg/dL
HGB URINE DIPSTICK: NEGATIVE
Ketones, ur: NEGATIVE mg/dL
Leukocytes, UA: NEGATIVE
Nitrite: NEGATIVE
PH: 6 (ref 5.0–8.0)
Protein, ur: NEGATIVE mg/dL
SPECIFIC GRAVITY, URINE: 1.02 (ref 1.005–1.030)

## 2016-02-08 LAB — WET PREP, GENITAL
SPERM: NONE SEEN
Trich, Wet Prep: NONE SEEN
Yeast Wet Prep HPF POC: NONE SEEN

## 2016-02-08 MED ORDER — METRONIDAZOLE 500 MG PO TABS: 500.0000 mg | ORAL_TABLET | Freq: Two times a day (BID) | ORAL | 0 refills | Status: DC

## 2016-02-08 MED ORDER — SODIUM CHLORIDE 0.9 % IV BOLUS (SEPSIS)
1000.0000 mL | Freq: Once | INTRAVENOUS | Status: AC
  Administered 2016-02-08: 1000 mL via INTRAVENOUS

## 2016-02-08 NOTE — ED Notes (Signed)
Per Scheryl Martenhristine Soliz RN, patient [facility]. No further TOCO monitoring needed.

## 2016-02-08 NOTE — ED Triage Notes (Signed)
Pt comes in with lower abdominal pressure that started several weeks ago. Pt states that she has seen Dr. Emelda FearFerguson about this and he told her this was normal. She continues to have this pain so she comes here. Denies any vaginal discharge or bleeding. Denies any vomiting or diarrhea.

## 2016-02-08 NOTE — Progress Notes (Signed)
Called to monitor fetal heart rate for G3P2, 31.5 weeks, who came [facility] for lower abdomen pressure x3 weeks. Per Ed RN, pelvic performed, cervix appeared closed, pt states baby moving well, denies leaking fluid or bleeding. Obtaining labs, and giving IV fluids. Pt gets PNC with Dr Emelda FearFerguson.

## 2016-02-08 NOTE — Progress Notes (Signed)
Called Dr Jolayne Pantheronstant informed of pt status, Reactive FHR tracing,, uc pattern. OB cleared. Called ED RN informed of pt being OB cleared.

## 2016-02-08 NOTE — ED Notes (Signed)
Scheryl Martenhristine Soliz RN at [facility] notified of patient. Patient being monitored.

## 2016-02-08 NOTE — ED Provider Notes (Signed)
[facility]-EMERGENCY DEPT Provider Note   CSN: [id number] Arrival date & time: 02/08/16  1304   By signing my name below, I, Avnee Patel, attest that this documentation has been prepared under the direction and in the presence of Margarita Grizzle, MD  Electronically Signed: Clovis Pu, ED Scribe. 02/08/16. 2:03 PM.   History   Chief Complaint Chief Complaint  Patient presents with  . Abdominal Pain    [redacted] weeks pregnant    The history is provided by the patient. No language interpreter was used.   HPI Comments:  Tammy Perry is a 35 y.o. female, [redacted] weeks pregnant, who presents to the Emergency Department complaining of intermittent pressurized L abdominal pain x 3 weeks. She states she feels pain which lasts 5 minutes ~ every other week. Pt denies abnormal vaginal discharge, vaginal bleeding, dysuria and other urinary symtpoms. She denies any comlications with her current pregnacny. Pt notes her previous pregnancies were normal and denies any preterm labor. She is followed by Dr. Emelda Fear. Last mentrual period was at the end of April. Her due date is 04/06/16.   Past Medical History:  Diagnosis Date  . Medical history non-contributory   . Nausea 08/20/2015  . Pregnant 08/20/2015    Patient Active Problem List   Diagnosis Date Noted  . Supervision of normal pregnancy 09/10/2015  . Previous cesarean delivery affecting pregnancy, antepartum 09/10/2015  . Hx successful VBAC (vaginal birth after cesarean), currently pregnant 09/10/2015  . Nausea 08/20/2015    Past Surgical History:  Procedure Laterality Date  . CESAREAN SECTION      OB History    Gravida Para Term Preterm AB Living   3 2 2     2    SAB TAB Ectopic Multiple Live Births           2       Home Medications    Prior to Admission medications   Medication Sig Start Date End Date Taking? Authorizing Provider  prenatal vitamin w/FE, FA (PRENATAL 1 + 1) 27-1 MG TABS tablet Take 1 tablet by mouth daily at 12  noon. 08/20/15   Adline Potter, NP  promethazine (PHENERGAN) 25 MG tablet Take 1 tablet (25 mg total) by mouth every 6 (six) hours as needed for nausea or vomiting. Patient not taking: Reported on 01/28/2016 08/20/15   Adline Potter, NP    Family History Family History  Problem Relation Age of Onset  . Cancer Mother     lung  . Hypertension Mother   . Hyperlipidemia Mother   . Asthma Son   . Diabetes Maternal Uncle     Social History Social History  Substance Use Topics  . Smoking status: Never Smoker  . Smokeless tobacco: Never Used  . Alcohol use No     Allergies   Review of patient's allergies indicates no known allergies.   Review of Systems Review of Systems  Gastrointestinal: Positive for abdominal pain.  Genitourinary: Negative for dysuria, vaginal bleeding and vaginal discharge.  All other systems reviewed and are negative.    Physical Exam Updated Vital Signs BP 127/76 (BP Location: Left Arm)   Pulse 83   Temp 97.6 F (36.4 C) (Oral)   Resp 18   Ht 5\' 6"  (1.676 m)   Wt 202 lb (91.6 kg)   LMP 07/21/2015 (Approximate)   SpO2 100%   BMI 32.60 kg/m   Physical Exam  Constitutional: She is oriented to person, place, and time. She appears well-developed and  well-nourished. No distress.  Fundus consistent with date.  HENT:  Head: Normocephalic and atraumatic.  Right Ear: External ear normal.  Left Ear: External ear normal.  Nose: Nose normal.  Mouth/Throat: Oropharynx is clear and moist.  Eyes: Conjunctivae and EOM are normal. Pupils are equal, round, and reactive to light.  Neck: Normal range of motion. Neck supple.  Cardiovascular: Normal rate and regular rhythm.   Pulmonary/Chest: Effort normal.  Abdominal: She exhibits no distension. There is no tenderness.  Genitourinary: Vaginal discharge found.  Genitourinary Comments: Gravid uterus.   Neurological: She is alert and oriented to person, place, and time.  Skin: Skin is warm and dry.    Psychiatric: She has a normal mood and affect.  Nursing note and vitals reviewed.    ED Treatments / Results  DIAGNOSTIC STUDIES:  Oxygen Saturation is 100% on RA, normal by my interpretation.    COORDINATION OF CARE:  1:58 PM Discussed treatment plan with pt at bedside and pt agreed to plan.  Labs (all labs ordered are listed, but only abnormal results are displayed) Labs Reviewed  WET PREP, GENITAL - Abnormal; Notable for the following:       Result Value   Clue Cells Wet Prep HPF POC PRESENT (*)    WBC, Wet Prep HPF POC FEW (*)    All other components within normal limits  CBC WITH DIFFERENTIAL/PLATELET - Abnormal; Notable for the following:    Hemoglobin 11.5 (*)    MCV 72.0 (*)    MCH 22.8 (*)    Platelets 147 (*)    All other components within normal limits  BASIC METABOLIC PANEL - Abnormal; Notable for the following:    Sodium 134 (*)    Anion gap 3 (*)    All other components within normal limits  URINALYSIS, ROUTINE W REFLEX MICROSCOPIC (NOT AT [facility])  GC/CHLAMYDIA PROBE AMP ([place]) NOT AT [facility]    EKG  EKG Interpretation None       Radiology No results found.  Procedures Procedures (including critical care time)  Medications Ordered in ED Medications  sodium chloride 0.9 % bolus 1,000 mL (not administered)     Initial Impression / Assessment and Plan / ED Course  I have reviewed the triage vital signs and the nursing notes.  Pertinent labs & imaging results that were available during my care of the patient were reviewed by me and considered in my medical decision making (see chart for details).  Clinical Course    [redacted] week pregnant female with  Pelvic pain.. Work up here reveals normal fhr and no contractions.  Wet prep with bv and will treat.  Patient had dicharge but nontender.   Final Clinical Impressions(s) / ED Diagnoses   Final diagnoses:  Pregnancy, unspecified gestational age    New Prescriptions New Prescriptions    METRONIDAZOLE (FLAGYL) 500 MG TABLET    Take 1 tablet (500 mg total) by mouth 2 (two) times daily.  I personally performed the services described in this documentation, which was scribed in my presence. The recorded information has been reviewed and considered.    Margarita Grizzleanielle Cincere Zorn, MD 02/08/16 [id number]

## 2016-02-08 NOTE — Discharge Instructions (Signed)
Please take medication as prescribed.  Recheck with Dr. Emelda FearFerguson as scheduled this Wednesday.

## 2016-02-09 LAB — GC/CHLAMYDIA PROBE AMP (~~LOC~~) NOT AT ARMC
CHLAMYDIA, DNA PROBE: NEGATIVE
NEISSERIA GONORRHEA: NEGATIVE

## 2016-02-11 ENCOUNTER — Encounter: Payer: Self-pay | Admitting: Advanced Practice Midwife

## 2016-02-11 ENCOUNTER — Ambulatory Visit (INDEPENDENT_AMBULATORY_CARE_PROVIDER_SITE_OTHER): Payer: Medicaid Other | Admitting: Advanced Practice Midwife

## 2016-02-11 VITALS — BP 110/70 | HR 72 | Wt 205.0 lb

## 2016-02-11 DIAGNOSIS — Z3A32 32 weeks gestation of pregnancy: Secondary | ICD-10-CM

## 2016-02-11 DIAGNOSIS — Z331 Pregnant state, incidental: Secondary | ICD-10-CM

## 2016-02-11 DIAGNOSIS — Z3483 Encounter for supervision of other normal pregnancy, third trimester: Secondary | ICD-10-CM

## 2016-02-11 DIAGNOSIS — Z1389 Encounter for screening for other disorder: Secondary | ICD-10-CM

## 2016-02-11 LAB — POCT URINALYSIS DIPSTICK
Blood, UA: NEGATIVE
Glucose, UA: NEGATIVE
KETONES UA: NEGATIVE
LEUKOCYTES UA: NEGATIVE
Nitrite, UA: NEGATIVE

## 2016-02-11 NOTE — Patient Instructions (Signed)

## 2016-02-11 NOTE — Progress Notes (Signed)
[id number] 3331w1d Estimated Date of Delivery: 04/06/16  Blood pressure 110/70, pulse 72, weight 205 lb (93 kg), last menstrual period 07/21/2015.   BP weight and urine results all reviewed and noted.  Please refer to the obstetrical flow sheet for the fundal height and fetal heart rate documentation:  Patient reports good fetal movement, denies any bleeding and no rupture of membranes symptoms or regular contractions. Patient is without complaints. All questions were answered.  Orders Placed This Encounter  Procedures  . POCT urinalysis dipstick    Plan:  Continued routine obstetrical care,  Return in about 2 weeks (around 02/25/2016) for LROB.

## 2016-02-25 ENCOUNTER — Ambulatory Visit (INDEPENDENT_AMBULATORY_CARE_PROVIDER_SITE_OTHER): Payer: Medicaid Other | Admitting: Women's Health

## 2016-02-25 ENCOUNTER — Encounter: Payer: Self-pay | Admitting: Women's Health

## 2016-02-25 VITALS — BP 130/74 | HR 76 | Wt 208.0 lb

## 2016-02-25 DIAGNOSIS — Z331 Pregnant state, incidental: Secondary | ICD-10-CM

## 2016-02-25 DIAGNOSIS — Z3483 Encounter for supervision of other normal pregnancy, third trimester: Secondary | ICD-10-CM

## 2016-02-25 DIAGNOSIS — Z3A34 34 weeks gestation of pregnancy: Secondary | ICD-10-CM

## 2016-02-25 DIAGNOSIS — Z1389 Encounter for screening for other disorder: Secondary | ICD-10-CM

## 2016-02-25 LAB — POCT URINALYSIS DIPSTICK
Glucose, UA: NEGATIVE
KETONES UA: NEGATIVE
LEUKOCYTES UA: NEGATIVE
Nitrite, UA: NEGATIVE

## 2016-02-25 NOTE — Progress Notes (Signed)
Low-risk OB appointment [id number] 1275w1d Estimated Date of Delivery: 04/06/16 BP 130/74   Pulse 76   Wt 208 lb (94.3 kg)   LMP 07/21/2015 (Approximate)   BMI 33.57 kg/m   BP, weight, and urine reviewed.  Refer to obstetrical flow sheet for FH & FHR.  Reports good fm.  Denies regular uc's, lof, vb, or uti s/s. No complaints. Reviewed ptl s/s, fkc. Plan:  Continue routine obstetrical care  F/U in 2wks for OB appointment

## 2016-02-25 NOTE — Patient Instructions (Signed)
Call the office ([phone]) or go to [facility] if:  You begin to have strong, frequent contractions  Your water breaks.  Sometimes it is a big gush of fluid, sometimes it is just a trickle that keeps getting your panties wet or running down your legs  You have vaginal bleeding.  It is normal to have a small amount of spotting if your cervix was checked.   You don't feel your baby moving like normal.  If you don't, get you something to eat and drink and lay down and focus on feeling your baby move.  You should feel at least 10 movements in 2 hours.  If you don't, you should call the office or go to [facility].    Preterm Labor Information Preterm labor is when labor starts at less than 37 weeks of pregnancy. The normal length of a pregnancy is 39 to 41 weeks. CAUSES Often, there is no identifiable underlying cause as to why a woman goes into preterm labor. One of the most common known causes of preterm labor is infection. Infections of the uterus, cervix, vagina, amniotic sac, bladder, kidney, or even the lungs (pneumonia) can cause labor to start. Other suspected causes of preterm labor include:   Urogenital infections, such as yeast infections and bacterial vaginosis.   Uterine abnormalities (uterine shape, uterine septum, fibroids, or bleeding from the placenta).   A cervix that has been operated on (it may fail to stay closed).   Malformations in the fetus.   Multiple gestations (twins, triplets, and so on).   Breakage of the amniotic sac.  RISK FACTORS  Having a previous history of preterm labor.   Having premature rupture of membranes (PROM).   Having a placenta that covers the opening of the cervix (placenta previa).   Having a placenta that separates from the uterus (placental abruption).   Having a cervix that is too weak to hold the fetus in the uterus (incompetent cervix).   Having too much fluid in the amniotic sac (polyhydramnios).   Taking  illegal drugs or smoking while pregnant.   Not gaining enough weight while pregnant.   Being younger than 18 and older than 35 years old.   Having a low socioeconomic status.   Being African American. SYMPTOMS Signs and symptoms of preterm labor include:   Menstrual-like cramps, abdominal pain, or back pain.  Uterine contractions that are regular, as frequent as six in an hour, regardless of their intensity (may be mild or painful).  Contractions that start on the top of the uterus and spread down to the lower abdomen and back.   A sense of increased pelvic pressure.   A watery or bloody mucus discharge that comes from the vagina.  TREATMENT Depending on the length of the pregnancy and other circumstances, your health care provider may suggest bed rest. If necessary, there are medicines that can be given to stop contractions and to mature the fetal lungs. If labor happens before 34 weeks of pregnancy, a prolonged hospital stay may be recommended. Treatment depends on the condition of both you and the fetus.  WHAT SHOULD YOU DO IF YOU THINK YOU ARE IN PRETERM LABOR? Call your health care provider right away. You will need to go to the hospital to get checked immediately. HOW CAN YOU PREVENT PRETERM LABOR IN FUTURE PREGNANCIES? You should:   Stop smoking if you smoke.  Maintain healthy weight gain and avoid chemicals and drugs that are not necessary.  Be watchful for   any type of infection.  Inform your health care provider if you have a known history of preterm labor.   This information is not intended to replace advice given to you by your health care provider. Make sure you discuss any questions you have with your health care provider.   Document Released: 07/03/2003 Document Revised: 12/13/2012 Document Reviewed: 05/15/2012 Elsevier Interactive Patient Education 2016 [employer] Inc[employer]  

## 2016-03-04 ENCOUNTER — Inpatient Hospital Stay (HOSPITAL_COMMUNITY)
Admission: AD | Admit: 2016-03-04 | Discharge: 2016-03-04 | Disposition: A | Discharge status: Home / Self Care | Payer: Medicaid Other | Source: Ambulatory Visit | Attending: Obstetrics & Gynecology | Admitting: Obstetrics & Gynecology

## 2016-03-04 ENCOUNTER — Encounter (HOSPITAL_COMMUNITY): Payer: Self-pay

## 2016-03-04 ENCOUNTER — Encounter: Payer: Medicaid Other | Admitting: Obstetrics & Gynecology

## 2016-03-04 DIAGNOSIS — O4693 Antepartum hemorrhage, unspecified, third trimester: Secondary | ICD-10-CM | POA: Insufficient documentation

## 2016-03-04 DIAGNOSIS — O26893 Other specified pregnancy related conditions, third trimester: Secondary | ICD-10-CM | POA: Insufficient documentation

## 2016-03-04 DIAGNOSIS — Z825 Family history of asthma and other chronic lower respiratory diseases: Secondary | ICD-10-CM | POA: Diagnosis not present

## 2016-03-04 DIAGNOSIS — Z8249 Family history of ischemic heart disease and other diseases of the circulatory system: Secondary | ICD-10-CM | POA: Insufficient documentation

## 2016-03-04 DIAGNOSIS — N760 Acute vaginitis: Secondary | ICD-10-CM

## 2016-03-04 DIAGNOSIS — Z801 Family history of malignant neoplasm of trachea, bronchus and lung: Secondary | ICD-10-CM | POA: Insufficient documentation

## 2016-03-04 DIAGNOSIS — Z833 Family history of diabetes mellitus: Secondary | ICD-10-CM | POA: Insufficient documentation

## 2016-03-04 DIAGNOSIS — Z3483 Encounter for supervision of other normal pregnancy, third trimester: Secondary | ICD-10-CM

## 2016-03-04 DIAGNOSIS — O23593 Infection of other part of genital tract in pregnancy, third trimester: Secondary | ICD-10-CM | POA: Diagnosis not present

## 2016-03-04 DIAGNOSIS — Z9889 Other specified postprocedural states: Secondary | ICD-10-CM | POA: Insufficient documentation

## 2016-03-04 DIAGNOSIS — O163 Unspecified maternal hypertension, third trimester: Secondary | ICD-10-CM | POA: Diagnosis not present

## 2016-03-04 DIAGNOSIS — Z3A35 35 weeks gestation of pregnancy: Secondary | ICD-10-CM

## 2016-03-04 DIAGNOSIS — B9689 Other specified bacterial agents as the cause of diseases classified elsewhere: Secondary | ICD-10-CM

## 2016-03-04 LAB — WET PREP, GENITAL
Sperm: NONE SEEN
Trich, Wet Prep: NONE SEEN
YEAST WET PREP: NONE SEEN

## 2016-03-04 LAB — COMPREHENSIVE METABOLIC PANEL
ALBUMIN: 3.1 g/dL — AB (ref 3.5–5.0)
ALK PHOS: 136 U/L — AB (ref 38–126)
ALT: 20 U/L (ref 14–54)
ANION GAP: 7 (ref 5–15)
AST: 27 U/L (ref 15–41)
BILIRUBIN TOTAL: 0.4 mg/dL (ref 0.3–1.2)
BUN: 10 mg/dL (ref 6–20)
CALCIUM: 9 mg/dL (ref 8.9–10.3)
CO2: 22 mmol/L (ref 22–32)
Chloride: 107 mmol/L (ref 101–111)
Creatinine, Ser: 0.72 mg/dL (ref 0.44–1.00)
GFR calc Af Amer: >60 mL/min (ref >60)
GLUCOSE: 83 mg/dL (ref 65–99)
Potassium: 3.9 mmol/L (ref 3.5–5.1)
Sodium: 136 mmol/L (ref 135–145)
TOTAL PROTEIN: 6.6 g/dL (ref 6.5–8.1)

## 2016-03-04 LAB — CBC
HCT: 36.7 % (ref 36.0–46.0)
Hemoglobin: 11.9 g/dL — ABNORMAL LOW (ref 12.0–15.0)
MCH: 23 pg — ABNORMAL LOW (ref 26.0–34.0)
MCHC: 32.4 g/dL (ref 30.0–36.0)
MCV: 71 fL — ABNORMAL LOW (ref 78.0–100.0)
PLATELETS: 130 10*3/uL — AB (ref 150–400)
RBC: 5.17 MIL/uL — ABNORMAL HIGH (ref 3.87–5.11)
RDW: 15.5 % (ref 11.5–15.5)
WBC: 7.3 10*3/uL (ref 4.0–10.5)

## 2016-03-04 LAB — URINALYSIS, ROUTINE W REFLEX MICROSCOPIC
BILIRUBIN URINE: NEGATIVE
GLUCOSE, UA: NEGATIVE mg/dL
KETONES UR: NEGATIVE mg/dL
Leukocytes, UA: NEGATIVE
Nitrite: NEGATIVE
PH: 6 (ref 5.0–8.0)
PROTEIN: NEGATIVE mg/dL
Specific Gravity, Urine: 1.015 (ref 1.005–1.030)

## 2016-03-04 LAB — URINE MICROSCOPIC-ADD ON

## 2016-03-04 LAB — PROTEIN / CREATININE RATIO, URINE
CREATININE, URINE: 165 mg/dL
Protein Creatinine Ratio: 0.27 mg/mg{Cre} — ABNORMAL HIGH (ref 0.00–0.15)
TOTAL PROTEIN, URINE: 45 mg/dL

## 2016-03-04 MED ORDER — METRONIDAZOLE 500 MG PO TABS: 500.0000 mg | ORAL_TABLET | Freq: Two times a day (BID) | ORAL | 0 refills | Status: AC

## 2016-03-04 NOTE — MAU Provider Note (Signed)
MAU HISTORY AND PHYSICAL  Chief Complaint:  Vaginal Bleeding   Shaelynn A Donzetta MattersGalloway is a 35 y.o.  [id number]  at 3438w2d presenting for Vaginal Bleeding . Patient states she has been having  none contractions, minimal vaginal bleeding, intact membranes, with active fetal movement.    Reports seeing a small amount of bright red blood after urinating this morning on the toilet paper. Had to put on a pad and there was a small spot of blood on the pad. Has not seen any bleeding since. Had some minor lower abdominal pain this morning that has since subsided. Last intercourse was 2 days ago.  Denies dysuria, urinary frequency, constipation, diarrhea, fevers, chills, nausea, vomiting.  Patient noted to have mildly elevated blood pressures. Denies headache, vision changes, RUQ pain. Has had swelling throughout pregnancy.   Past Medical History:  Diagnosis Date  . Medical history non-contributory   . Nausea 08/20/2015  . Pregnant 08/20/2015    Past Surgical History:  Procedure Laterality Date  . CESAREAN SECTION      Family History  Problem Relation Age of Onset  . Cancer Mother     lung  . Hypertension Mother   . Hyperlipidemia Mother   . Asthma Son   . Diabetes Maternal Uncle     Social History  Substance Use Topics  . Smoking status: Never Smoker  . Smokeless tobacco: Never Used  . Alcohol use No    No Known Allergies  No prescriptions prior to admission.    Review of Systems - Negative except for what is mentioned in HPI.  Physical Exam  Blood pressure 147/90, pulse 68, temperature 98.2 F (36.8 C), temperature source Oral, resp. rate 18, height 5\' 6"  (1.676 m), weight 96.8 kg (213 lb 6.4 oz), last menstrual period 07/21/2015. GENERAL: Well-developed, well-nourished female in no acute distress.  LUNGS: No respiratory distress HEART: Regular rate ABDOMEN: Soft, nontender, nondistended, gravid abdomen.  EXTREMITIES: Nontender, no edema, 2+ distal pulses. GU: copious frothy  blood tinged discharge, cervix appears irritated and friable. No cervical motion tenderness. Cervix visually closed Presentation: cephalic FHT:  Baseline 140, moderate variability, accelerations present, no decelerations Contractions: none   Labs: Results for orders placed or performed during the hospital encounter of 03/04/16 (from the past 24 hour(s))  Urinalysis, Routine w reflex microscopic (not at [facility])   Collection Time: 03/04/16 11:23 AM  Result Value Ref Range   Color, Urine YELLOW YELLOW   APPearance CLEAR CLEAR   Specific Gravity, Urine 1.015 1.005 - 1.030   pH 6.0 5.0 - 8.0   Glucose, UA NEGATIVE NEGATIVE mg/dL   Hgb urine dipstick SMALL (A) NEGATIVE   Bilirubin Urine NEGATIVE NEGATIVE   Ketones, ur NEGATIVE NEGATIVE mg/dL   Protein, ur NEGATIVE NEGATIVE mg/dL   Nitrite NEGATIVE NEGATIVE   Leukocytes, UA NEGATIVE NEGATIVE  Urine microscopic-add on   Collection Time: 03/04/16 11:23 AM  Result Value Ref Range   Squamous Epithelial / LPF 0-5 (A) NONE SEEN   WBC, UA 0-5 0 - 5 WBC/hpf   RBC / HPF 0-5 0 - 5 RBC/hpf   Bacteria, UA FEW (A) NONE SEEN  Protein / creatinine ratio, urine   Collection Time: 03/04/16 11:23 AM  Result Value Ref Range   Creatinine, Urine 165.00 mg/dL   Total Protein, Urine 45 mg/dL   Protein Creatinine Ratio 0.27 (H) 0.00 - 0.15 mg/mg[Cre]  Wet prep, genital   Collection Time: 03/04/16 12:35 PM  Result Value Ref Range   Yeast Wet Prep  HPF POC NONE SEEN NONE SEEN   Trich, Wet Prep NONE SEEN NONE SEEN   Clue Cells Wet Prep HPF POC PRESENT (A) NONE SEEN   WBC, Wet Prep HPF POC MODERATE BACTERIA SEEN (A) NONE SEEN   Sperm NONE SEEN   Comprehensive metabolic panel   Collection Time: 03/04/16  2:04 PM  Result Value Ref Range   Sodium 136 135 - 145 mmol/L   Potassium 3.9 3.5 - 5.1 mmol/L   Chloride 107 101 - 111 mmol/L   CO2 22 22 - 32 mmol/L   Glucose, Bld 83 65 - 99 mg/dL   BUN 10 6 - 20 mg/dL   Creatinine, Ser 4.09 0.44 - 1.00 mg/dL    Calcium 9.0 8.9 - 81.1 mg/dL   Total Protein 6.6 6.5 - 8.1 g/dL   Albumin 3.1 (L) 3.5 - 5.0 g/dL   AST 27 15 - 41 U/L   ALT 20 14 - 54 U/L   Alkaline Phosphatase 136 (H) 38 - 126 U/L   Total Bilirubin 0.4 0.3 - 1.2 mg/dL   GFR calc non Af Amer >60 >60 mL/min   GFR calc Af Amer >60 >60 mL/min   Anion gap 7 5 - 15  CBC   Collection Time: 03/04/16  2:04 PM  Result Value Ref Range   WBC 7.3 4.0 - 10.5 K/uL   RBC 5.17 (H) 3.87 - 5.11 MIL/uL   Hemoglobin 11.9 (L) 12.0 - 15.0 g/dL   HCT 91.4 78.2 - 95.6 %   MCV 71.0 (L) 78.0 - 100.0 fL   MCH 23.0 (L) 26.0 - 34.0 pg   MCHC 32.4 30.0 - 36.0 g/dL   RDW 21.3 08.6 - 57.8 %   Platelets 130 (L) 150 - 400 K/uL    Imaging Studies:  No results found.  Assessment: LOISE ESGUERRA is  35 y.o. G3P2002 at [redacted]w[redacted]d presents with Vaginal Bleeding . MDM UA Wet prep GC/Ct CMP, CBC, urine protein:creatine ratio   Plan:  Bacterial Vaginosis- rx given for Flagyl  Hypertension in pregnancy- some mildly elevated pressures noted during MAU course, none previously Without symptoms, labs within normal limits Patient has follow up scheduled tomorrow morning with OB for blood pressure check Return precautions given for development of severe features Patient verbalized understanding and agreement with plan  Tillman Sers, DO PGY-1 11/9/20176:05 PM   Midwife attestation:  I have seen and examined this patient; I agree with above documentation in the residen's note.   Robertha A Loney is a 35 y.o. [id number] reporting pink/light red spotting when wiping x 2 episodes today.  Her bleeding resolved by arrival in MAU without treatment.  Nothing made the symptom better or worse.  It was intermittent, mild, and resolved spontaneously.  She reports intermittent headaches and episodes of seeing spots during her entire pregnancy.  She reports that these symptoms have not changed recently and she denies h/a, visual disturbances, or epigastric pain today. +FM,  denies LOF, contractions, n/v, urinary symptoms, fever/chills  PE: BP 147/90 (BP Location: Left Arm)   Pulse 68   Temp 98.2 F (36.8 C) (Oral)   Resp 18   Ht 5\' 6"  (1.676 m)   Wt 213 lb 6.4 oz (96.8 kg)   LMP 07/21/2015 (Approximate)   BMI 34.44 kg/m   Patient Vitals for the past 24 hrs:  BP Temp Temp src Pulse Resp Height Weight  03/04/16 1605 147/90 98.2 F (36.8 C) Oral 68 18 - -  03/04/16 1431 130/89 - -  75 - - -  03/04/16 1415 133/76 - - 75 - - -  03/04/16 1345 123/85 - - 76 - - -  03/04/16 1330 142/86 - - 81 - - -  03/04/16 1314 131/84 - - 94 - - -  03/04/16 1258 133/83 - - 67 - - -  03/04/16 1244 130/87 - - 70 - - -  03/04/16 1228 141/80 - - 74 - - -  03/04/16 1213 124/79 - - 75 - - -  03/04/16 1203 124/82 - - 75 - - -  03/04/16 1200 135/87 - - 89 - - -  03/04/16 1121 145/83 98.4 F (36.9 C) Oral 85 18 5\' 6"  (1.676 m) 213 lb 6.4 oz (96.8 kg)   Gen: calm comfortable, NAD Resp: normal effort, no distress Abd: gravid  ROS, labs, PMH reviewed NST reactivce  A/P: Follow up at [facility] for BP check tomorrow Flagyl 500 mg BID x 7 days for BV Bleeding and preeclampsia precautions given/reasons to return to hospital  LEFTWICH-KIRBY, LISA, CNM  7:19 PM

## 2016-03-04 NOTE — MAU Note (Signed)
Pt stated she started having some vaginal bleeding when she wiped to day. bleeding a little more now. Reports a little bit of cramping. Good fetal movement reported.

## 2016-03-04 NOTE — Discharge Instructions (Signed)
Please [facility] tomorrow [facility] and follow up with your Obstetrician.  Return if you develop severe headache, vision changes, upper abdominal pain, or new swelling.  Bacterial Vaginosis Bacterial vaginosis is a vaginal infection that occurs when the normal balance of bacteria in the vagina is disrupted. It results from an overgrowth of certain bacteria. This is the most common vaginal infection in women of childbearing age. Treatment is important to prevent complications, especially in pregnant women, as it can cause a premature delivery. CAUSES  Bacterial vaginosis is caused by an increase in harmful bacteria that are normally present in smaller amounts in the vagina. Several different kinds of bacteria can cause bacterial vaginosis. However, the reason that the condition develops is not fully understood. RISK FACTORS Certain activities or behaviors can put you at an increased risk of developing bacterial vaginosis, including:  Having a new sex partner or multiple sex partners.  Douching.  Using an intrauterine device (IUD) for contraception. Women do not get bacterial vaginosis from toilet seats, bedding, swimming pools, or contact with objects around them. SIGNS AND SYMPTOMS  Some women with bacterial vaginosis have no signs or symptoms. Common symptoms include:  Grey vaginal discharge.  A fishlike odor with discharge, especially after sexual intercourse.  Itching or burning of the vagina and vulva.  Burning or pain with urination. DIAGNOSIS  Your health care provider will take a medical history and examine the vagina for signs of bacterial vaginosis. A sample of vaginal fluid may be taken. Your health care provider will look at this sample under a microscope to check for bacteria and abnormal cells. A vaginal pH test may also be done.  TREATMENT  Bacterial vaginosis may be treated with antibiotic medicines. These may be given in the form of a pill or a  vaginal cream. A second round of antibiotics may be prescribed if the condition comes back after treatment. Because bacterial vaginosis increases your risk for sexually transmitted diseases, getting treated can help reduce your risk for chlamydia, gonorrhea, HIV, and herpes. HOME CARE INSTRUCTIONS   Only take over-the-counter or prescription medicines as directed by your health care provider.  If antibiotic medicine was prescribed, take it as directed. Make sure you finish it even if you start to feel better.  Tell all sexual partners that you have a vaginal infection. They should see their health care provider and be treated if they have problems, such as a mild rash or itching.  During treatment, it is important that you follow these instructions:  Avoid sexual activity or use condoms correctly.  Do not douche.  Avoid alcohol as directed by your health care provider.  Avoid breastfeeding as directed by your health care provider. SEEK MEDICAL CARE IF:   Your symptoms are not improving after 3 days of treatment.  You have increased discharge or pain.  You have a fever. MAKE SURE YOU:   Understand these instructions.  Will watch your condition.  Will get help right away if you are not doing well or get worse. FOR MORE INFORMATION  Centers for Disease Control and Prevention, Division of STD Prevention: SolutionApps.co.zawww.cdc.gov/std American Sexual Health Association (ASHA): www.ashastd.org    This information is not intended to replace advice given to you by your health care provider. Make sure you discuss any questions you have with your health care provider.   Document Released: 04/12/2005 Document Revised: 05/03/2014 Document Reviewed: 11/22/2012 Elsevier Interactive Patient Education [employer]2016 Elsevier Inc.

## 2016-03-05 ENCOUNTER — Encounter: Payer: Self-pay | Admitting: *Deleted

## 2016-03-05 ENCOUNTER — Ambulatory Visit (INDEPENDENT_AMBULATORY_CARE_PROVIDER_SITE_OTHER): Payer: Medicaid Other | Admitting: *Deleted

## 2016-03-05 VITALS — BP 120/72 | HR 70 | Ht 66.0 in | Wt 213.5 lb

## 2016-03-05 DIAGNOSIS — Z1389 Encounter for screening for other disorder: Secondary | ICD-10-CM

## 2016-03-05 DIAGNOSIS — Z0130 Encounter for examination of blood pressure without abnormal findings: Secondary | ICD-10-CM

## 2016-03-05 DIAGNOSIS — Z331 Pregnant state, incidental: Secondary | ICD-10-CM

## 2016-03-05 LAB — POCT URINALYSIS DIPSTICK
GLUCOSE UA: NEGATIVE
Ketones, UA: NEGATIVE
LEUKOCYTES UA: NEGATIVE
NITRITE UA: NEGATIVE
RBC UA: NEGATIVE

## 2016-03-05 LAB — GC/CHLAMYDIA PROBE AMP (~~LOC~~) NOT AT ARMC
CHLAMYDIA, DNA PROBE: NEGATIVE
Neisseria Gonorrhea: NEGATIVE

## 2016-03-05 NOTE — Progress Notes (Signed)
Pt here for BP check. BP today was 120/70. [facility] and [facility] was [facility]. Pt was given prescription for Flagyl for BV, but pt would like something she can insert vaginally due to getting a yeast infection. I spoke with Selena BattenKim, CNM and she advised pt needs to take Flagyl by mouth. Can eat yogurt BID while on Flagyl and if she gets a yeast infection, can do OTC Monistat 7. Pt voiced understanding. Return on 11/15 at scheduled appt or sooner if need be. JSY

## 2016-03-10 ENCOUNTER — Ambulatory Visit (INDEPENDENT_AMBULATORY_CARE_PROVIDER_SITE_OTHER): Payer: Medicaid Other | Admitting: Advanced Practice Midwife

## 2016-03-10 ENCOUNTER — Encounter: Payer: Self-pay | Admitting: Advanced Practice Midwife

## 2016-03-10 VITALS — BP 122/76 | HR 92 | Wt 212.0 lb

## 2016-03-10 DIAGNOSIS — Z331 Pregnant state, incidental: Secondary | ICD-10-CM

## 2016-03-10 DIAGNOSIS — Z3A36 36 weeks gestation of pregnancy: Secondary | ICD-10-CM

## 2016-03-10 DIAGNOSIS — O1213 Gestational proteinuria, third trimester: Secondary | ICD-10-CM

## 2016-03-10 DIAGNOSIS — Z3483 Encounter for supervision of other normal pregnancy, third trimester: Secondary | ICD-10-CM

## 2016-03-10 DIAGNOSIS — Z1389 Encounter for screening for other disorder: Secondary | ICD-10-CM

## 2016-03-10 LAB — POCT URINALYSIS DIPSTICK
Glucose, UA: NEGATIVE
KETONES UA: NEGATIVE
Leukocytes, UA: NEGATIVE
Nitrite, UA: NEGATIVE
RBC UA: NEGATIVE

## 2016-03-10 NOTE — Progress Notes (Signed)
[id number] 1427w1d Estimated Date of Delivery: 04/06/16  Blood pressure 122/76, pulse 92, weight 212 lb (96.2 kg), last menstrual period 07/21/2015.   BP weight and urine results all reviewed and noted. Today there is 3+ protein, no blood/hemoglobin.  Has had intermittent proteinuria up to 1+, last urine culture in August.   Please refer to the obstetrical flow sheet for the fundal height and fetal heart rate documentation:  Patient reports good fetal movement, denies any bleeding and no rupture of membranes symptoms or regular contractions. Patient is without complaints. No HA, RUQ pain, vision changes.  Has had brown spotting for almost a week, went to [facility] 11/10.  BP was 147/90, so had preX labs  All nornmal except platelets were 130 (previously normal).  All questions were answered.  Orders Placed This Encounter  Procedures  . Urine culture  . CBC  . Comprehensive metabolic panel  . Protein, urine, 24 hour  . POCT urinalysis dipstick    Plan:  PreX labs, urine culture.  Concern is for atypical preX, given low platelets.      Return in about 2 days (around 03/12/2016) for LROB.

## 2016-03-10 NOTE — Patient Instructions (Signed)
Hypertension During Pregnancy °Hypertension, commonly called high blood pressure, is when the force of blood pumping through your arteries is too strong. Arteries are blood vessels that carry blood from the heart throughout the body. Hypertension during pregnancy can cause problems for you and your baby. Your baby may be born early (prematurely) or may not weigh as much as he or she should at birth. Very bad cases of hypertension during pregnancy can be life-threatening. °Different types of hypertension can occur during pregnancy. These include: °· Chronic hypertension. This happens when: °¨ You have hypertension before pregnancy and it continues during pregnancy. °¨ You develop hypertension before you are [redacted] weeks pregnant, and it continues during pregnancy. °· Gestational hypertension. This is hypertension that develops after the 20th week of pregnancy. °· Preeclampsia, also called toxemia of pregnancy. This is a very serious type of hypertension that develops only during pregnancy. It affects the whole body, and it can be very dangerous for you and your baby. °Gestational hypertension and preeclampsia usually go away within 6 weeks after your baby is born. Women who have hypertension during pregnancy have a greater chance of developing hypertension later in life or during future pregnancies. °What are the causes? °The exact cause of hypertension is not known. °What increases the risk? °There are certain factors that make it more likely for you to develop hypertension during pregnancy. These include: °· Having hypertension during a previous pregnancy or prior to pregnancy. °· Being overweight. °· Being older than age 40. °· Being pregnant for the first time or being pregnant with more than one baby. °· Becoming pregnant using fertilization methods such as IVF (in vitro fertilization). °· Having diabetes, kidney problems, or systemic lupus erythematosus. °· Having a family history of hypertension. °What are the  signs or symptoms? °Chronic hypertension and gestational hypertension rarely cause symptoms. Preeclampsia causes symptoms, which may include: °· Increased protein in your urine. Your health care provider will check for this at every visit before you give birth (prenatal visit). °· Severe headaches. °· Sudden weight gain. °· Swelling of the hands, face, legs, and feet. °· Nausea and vomiting. °· Vision problems, such as blurred or double vision. °· Numbness in the face, arms, legs, and feet. °· Dizziness. °· Slurred speech. °· Sensitivity to bright lights. °· Abdominal pain. °· Convulsions. °How is this diagnosed? °You may be diagnosed with hypertension during a routine prenatal exam. At each prenatal visit, you may: °· Have a urine test to check for high amounts of protein in your urine. °· Have your blood pressure checked. A blood pressure reading is recorded as two numbers, such as "120 over 80" (or 120/80). The first ("top") number is called the systolic pressure. It is a measure of the pressure in your arteries when your heart beats. The second ("bottom") number is called the diastolic pressure. It is a measure of the pressure in your arteries as your heart relaxes between beats. Blood pressure is measured in a unit called mm Hg. A normal blood pressure reading is: °¨ Systolic: below 120. °¨ Diastolic: below 80. °The type of hypertension that you are diagnosed with depends on your test results and when your symptoms developed. °· Chronic hypertension is usually diagnosed before 20 weeks of pregnancy. °· Gestational hypertension is usually diagnosed after 20 weeks of pregnancy. °· Hypertension with high amounts of protein in the urine is diagnosed as preeclampsia. °· Blood pressure measurements that stay above 160 systolic, or above 110 diastolic, are signs of severe preeclampsia. °  How is this treated? °Treatment for hypertension during pregnancy varies depending on the type of hypertension you have and how  serious it is. °· If you take medicines called ACE inhibitors to treat chronic hypertension, you may need to switch medicines. ACE inhibitors should not be taken during pregnancy. °· If you have gestational hypertension, you may need to take blood pressure medicine. °· If you are at risk for preeclampsia, your health care provider may recommend that you take a low-dose aspirin every day to prevent high blood pressure during your pregnancy. °· If you have severe preeclampsia, you may need to be hospitalized so you and your baby can be monitored closely. You may also need to take medicine (magnesium sulfate) to prevent seizures and to lower blood pressure. This medicine may be given as an injection or through an IV tube. °· In some cases, if your condition gets worse, you may need to deliver your baby early. °Follow these instructions at home: °Eating and drinking °· Drink enough fluid to keep your urine clear or pale yellow. °· Eat a healthy diet that is low in salt (sodium). Do not add salt to your food. Check food labels to see how much sodium a food or beverage contains. °Lifestyle °· Do not use any products that contain nicotine or tobacco, such as cigarettes and e-cigarettes. If you need help quitting, ask your health care provider. °· Do not use alcohol. °· Avoid caffeine. °· Avoid stress as much as possible. Rest and get plenty of sleep. °General instructions °· Take over-the-counter and prescription medicines only as told by your health care provider. °· While lying down, lie on your left side. This keeps pressure off your baby. °· While sitting or lying down, raise (elevate) your feet. Try putting some pillows under your lower legs. °· Exercise regularly. Ask your health care provider what kinds of exercise are best for you. °· Keep all prenatal and follow-up visits as told by your health care provider. This is important. °Contact a health care provider if: °· You have symptoms that your health care provider  told you may require more treatment or monitoring, such as: °¨ Fever. °¨ Vomiting. °¨ Headache. °Get help right away if: °· You have severe abdominal pain or vomiting that does not get better with treatment. °· You suddenly develop swelling in your hands, ankles, or face. °· You gain 4 lbs (1.8 kg) or more in 1 week. °· You develop vaginal bleeding, or you have blood in your urine. °· You do not feel your baby moving as much as usual. °· You have blurred or double vision. °· You have muscle twitching or sudden tightening (spasms). °· You have shortness of breath. °· Your lips or fingernails turn blue. °This information is not intended to replace advice given to you by your health care provider. Make sure you discuss any questions you have with your health care provider. °Document Released: 12/29/2010 Document Revised: 10/31/2015 Document Reviewed: 09/26/2015 °Elsevier Interactive Patient Education © 2017 Elsevier Inc. ° °

## 2016-03-10 NOTE — Addendum Note (Signed)
Addended by: Criss AlvinePULLIAM, CHRYSTAL G on: 03/10/2016 12:00 PM   Modules accepted: Orders

## 2016-03-11 LAB — COMPREHENSIVE METABOLIC PANEL
ALK PHOS: 171 IU/L — AB (ref 39–117)
ALT: 16 IU/L (ref 0–32)
AST: 26 IU/L (ref 0–40)
Albumin/Globulin Ratio: 1.3 (ref 1.2–2.2)
Albumin: 3.6 g/dL (ref 3.5–5.5)
BUN / CREAT RATIO: 11 (ref 9–23)
BUN: 10 mg/dL (ref 6–20)
CHLORIDE: 104 mmol/L (ref 96–106)
CO2: 22 mmol/L (ref 18–29)
Calcium: 8.9 mg/dL (ref 8.7–10.2)
Creatinine, Ser: 0.93 mg/dL (ref 0.57–1.00)
GFR calc Af Amer: 92 mL/min/{1.73_m2} (ref >59)
GFR calc non Af Amer: 80 mL/min/{1.73_m2} (ref >59)
GLUCOSE: 68 mg/dL (ref 65–99)
Globulin, Total: 2.7 g/dL (ref 1.5–4.5)
Potassium: 4.7 mmol/L (ref 3.5–5.2)
Sodium: 140 mmol/L (ref 134–144)
Total Protein: 6.3 g/dL (ref 6.0–8.5)

## 2016-03-11 LAB — CBC
Hematocrit: 37.2 % (ref 34.0–46.6)
Hemoglobin: 11.8 g/dL (ref 11.1–15.9)
MCH: 22.9 pg — AB (ref 26.6–33.0)
MCHC: 31.7 g/dL (ref 31.5–35.7)
MCV: 72 fL — ABNORMAL LOW (ref 79–97)
PLATELETS: 128 10*3/uL — AB (ref 150–379)
RBC: 5.16 x10E6/uL (ref 3.77–5.28)
RDW: 16.3 % — AB (ref 12.3–15.4)
WBC: 7.1 10*3/uL (ref 3.4–10.8)

## 2016-03-12 ENCOUNTER — Ambulatory Visit (INDEPENDENT_AMBULATORY_CARE_PROVIDER_SITE_OTHER): Payer: Medicaid Other | Admitting: Obstetrics and Gynecology

## 2016-03-12 VITALS — BP 124/80 | HR 109 | Wt 210.2 lb

## 2016-03-12 DIAGNOSIS — Z3483 Encounter for supervision of other normal pregnancy, third trimester: Secondary | ICD-10-CM

## 2016-03-12 DIAGNOSIS — Z331 Pregnant state, incidental: Secondary | ICD-10-CM

## 2016-03-12 DIAGNOSIS — Z3493 Encounter for supervision of normal pregnancy, unspecified, third trimester: Secondary | ICD-10-CM

## 2016-03-12 DIAGNOSIS — Z3403 Encounter for supervision of normal first pregnancy, third trimester: Secondary | ICD-10-CM

## 2016-03-12 DIAGNOSIS — Z3A37 37 weeks gestation of pregnancy: Secondary | ICD-10-CM

## 2016-03-12 DIAGNOSIS — O34219 Maternal care for unspecified type scar from previous cesarean delivery: Secondary | ICD-10-CM

## 2016-03-12 DIAGNOSIS — O1213 Gestational proteinuria, third trimester: Secondary | ICD-10-CM

## 2016-03-12 DIAGNOSIS — Z1389 Encounter for screening for other disorder: Secondary | ICD-10-CM

## 2016-03-12 LAB — URINE CULTURE

## 2016-03-12 LAB — POCT URINALYSIS DIPSTICK
Blood, UA: NEGATIVE
GLUCOSE UA: NEGATIVE
Ketones, UA: NEGATIVE
LEUKOCYTES UA: NEGATIVE
NITRITE UA: NEGATIVE
PROTEIN UA: 3

## 2016-03-12 NOTE — Progress Notes (Addendum)
[id number]  Estimated Date of Delivery: 04/06/16 LROB 4265w3d, now with proteinuria.  Blood pressure 124/80, pulse (!) 109, weight 210 lb 3.2 oz (95.3 kg), last menstrual period 07/21/2015.   Urine results:notable for 3 protein  Chief Complaint  Patient presents with  . Routine Prenatal Visit    Patient complaints: none at this time. She denies HA, blurry vision. Patient reports   good fetal movement,                           denies any bleeding , rupture of membranes,or regular contractions.   refer to the ob flow sheet for FH and FHR, ,                          Physical Examination: General appearance - alert, well appearing, and in no distress                                      Abdomen - FH 36 ,                                                         -FHR 166                                                         soft, nontender, nondistended, no masses or organomegaly                                      Pelvic - closed                                             Questions were answered. Assessment: LROB G3P2002 @ 9065w3d, group B strep specimen collected                         Proteinuria with - PIH labs,                          224 hr TP submitted to Lab today Plan:  Continued routine obstetrical care,   F/u in 4-5 days for routine prenatal visit  By signing my name below, I, Sonum Patel, attest that this documentation has been prepared under the direction and in the presence of Tilda BurrowJohn V Jamonta Goerner, MD. Electronically Signed: Sonum Patel, Neurosurgeoncribe. 03/12/16. 1:02 PM.  I personally performed the services described in this documentation, which was SCRIBED in my presence. The recorded information has been reviewed and considered accurate. It has been edited as necessary during review. Tilda BurrowFERGUSON,Nataliya Graig V, MD

## 2016-03-13 LAB — PROTEIN, URINE, 24 HOUR
PROTEIN 24H UR: 671 mg/(24.h) — AB (ref 30–150)
PROTEIN UR: 111.8 mg/dL

## 2016-03-13 LAB — STREP GP B NAA

## 2016-03-16 ENCOUNTER — Inpatient Hospital Stay (HOSPITAL_COMMUNITY)
Admission: AD | Admit: 2016-03-16 | Discharge: 2016-03-19 | DRG: 774 | Disposition: A | Discharge status: Home / Self Care | Payer: Medicaid Other | Source: Ambulatory Visit | Attending: Obstetrics & Gynecology | Admitting: Obstetrics & Gynecology

## 2016-03-16 ENCOUNTER — Encounter: Payer: Self-pay | Admitting: Obstetrics & Gynecology

## 2016-03-16 ENCOUNTER — Ambulatory Visit (INDEPENDENT_AMBULATORY_CARE_PROVIDER_SITE_OTHER): Payer: Medicaid Other | Admitting: Obstetrics & Gynecology

## 2016-03-16 ENCOUNTER — Inpatient Hospital Stay (HOSPITAL_COMMUNITY): Payer: Medicaid Other | Admitting: Anesthesiology

## 2016-03-16 ENCOUNTER — Encounter (HOSPITAL_COMMUNITY): Payer: Self-pay

## 2016-03-16 VITALS — BP 130/90 | HR 76 | Wt 211.0 lb

## 2016-03-16 DIAGNOSIS — Z1389 Encounter for screening for other disorder: Secondary | ICD-10-CM

## 2016-03-16 DIAGNOSIS — O4593 Premature separation of placenta, unspecified, third trimester: Secondary | ICD-10-CM | POA: Diagnosis present

## 2016-03-16 DIAGNOSIS — Z833 Family history of diabetes mellitus: Secondary | ICD-10-CM

## 2016-03-16 DIAGNOSIS — Z3A37 37 weeks gestation of pregnancy: Secondary | ICD-10-CM

## 2016-03-16 DIAGNOSIS — O1403 Mild to moderate pre-eclampsia, third trimester: Secondary | ICD-10-CM

## 2016-03-16 DIAGNOSIS — O1490 Unspecified pre-eclampsia, unspecified trimester: Secondary | ICD-10-CM | POA: Diagnosis present

## 2016-03-16 DIAGNOSIS — Z3490 Encounter for supervision of normal pregnancy, unspecified, unspecified trimester: Secondary | ICD-10-CM

## 2016-03-16 DIAGNOSIS — O34219 Maternal care for unspecified type scar from previous cesarean delivery: Secondary | ICD-10-CM | POA: Diagnosis present

## 2016-03-16 DIAGNOSIS — Z8249 Family history of ischemic heart disease and other diseases of the circulatory system: Secondary | ICD-10-CM

## 2016-03-16 DIAGNOSIS — O85 Puerperal sepsis: Secondary | ICD-10-CM | POA: Diagnosis not present

## 2016-03-16 DIAGNOSIS — Z331 Pregnant state, incidental: Secondary | ICD-10-CM

## 2016-03-16 DIAGNOSIS — Z3482 Encounter for supervision of other normal pregnancy, second trimester: Secondary | ICD-10-CM | POA: Diagnosis not present

## 2016-03-16 DIAGNOSIS — O1404 Mild to moderate pre-eclampsia, complicating childbirth: Principal | ICD-10-CM | POA: Diagnosis present

## 2016-03-16 DIAGNOSIS — O34211 Maternal care for low transverse scar from previous cesarean delivery: Secondary | ICD-10-CM | POA: Diagnosis present

## 2016-03-16 HISTORY — DX: Unspecified pre-eclampsia, unspecified trimester: O14.90

## 2016-03-16 LAB — COMPREHENSIVE METABOLIC PANEL
ALBUMIN: 3.1 g/dL — AB (ref 3.5–5.0)
ALK PHOS: 151 U/L — AB (ref 38–126)
ALT: 18 U/L (ref 14–54)
AST: 33 U/L (ref 15–41)
Anion gap: 9 (ref 5–15)
BILIRUBIN TOTAL: 0.2 mg/dL — AB (ref 0.3–1.2)
BUN: 14 mg/dL (ref 6–20)
CALCIUM: 9 mg/dL (ref 8.9–10.3)
CO2: 20 mmol/L — ABNORMAL LOW (ref 22–32)
Chloride: 104 mmol/L (ref 101–111)
Creatinine, Ser: 0.94 mg/dL (ref 0.44–1.00)
GFR calc Af Amer: >60 mL/min (ref >60)
GLUCOSE: 98 mg/dL (ref 65–99)
POTASSIUM: 4 mmol/L (ref 3.5–5.1)
Sodium: 133 mmol/L — ABNORMAL LOW (ref 135–145)
TOTAL PROTEIN: 6.2 g/dL — AB (ref 6.5–8.1)

## 2016-03-16 LAB — OB RESULTS CONSOLE GBS: STREP GROUP B AG: NEGATIVE

## 2016-03-16 LAB — CBC
HCT: 36.4 % (ref 36.0–46.0)
HEMOGLOBIN: 11.7 g/dL — AB (ref 12.0–15.0)
MCH: 22.9 pg — ABNORMAL LOW (ref 26.0–34.0)
MCHC: 32.1 g/dL (ref 30.0–36.0)
MCV: 71.4 fL — ABNORMAL LOW (ref 78.0–100.0)
PLATELETS: 161 10*3/uL (ref 150–400)
RBC: 5.1 MIL/uL (ref 3.87–5.11)
RDW: 15.3 % (ref 11.5–15.5)
WBC: 6.8 10*3/uL (ref 4.0–10.5)

## 2016-03-16 LAB — GROUP B STREP BY PCR: Group B strep by PCR: NEGATIVE

## 2016-03-16 LAB — POCT URINALYSIS DIPSTICK
GLUCOSE UA: NEGATIVE
Ketones, UA: NEGATIVE
LEUKOCYTES UA: NEGATIVE
NITRITE UA: NEGATIVE
Protein, UA: 4
RBC UA: NEGATIVE

## 2016-03-16 LAB — GC/CHLAMYDIA PROBE AMP
CHLAMYDIA, DNA PROBE: NEGATIVE
Neisseria gonorrhoeae by PCR: NEGATIVE

## 2016-03-16 MED ORDER — LACTATED RINGERS IV SOLN: 500.0000 mL | INTRAVENOUS | Status: DC | PRN

## 2016-03-16 MED ORDER — PHENYLEPHRINE 40 MCG/ML (10ML) SYRINGE FOR IV PUSH (FOR BLOOD PRESSURE SUPPORT)
80.0000 ug | PREFILLED_SYRINGE | INTRAVENOUS | Status: DC | PRN
  Filled 2016-03-16: qty 5
  Filled 2016-03-16 (×2): qty 10

## 2016-03-16 MED ORDER — LIDOCAINE HCL (PF) 1 % IJ SOLN
INTRAMUSCULAR | Status: DC | PRN
  Administered 2016-03-16: 4 mL via EPIDURAL

## 2016-03-16 MED ORDER — LACTATED RINGERS IV SOLN
500.0000 mL | Freq: Once | INTRAVENOUS | Status: AC
  Administered 2016-03-16: 500 mL via INTRAVENOUS

## 2016-03-16 MED ORDER — FENTANYL 2.5 MCG/ML BUPIVACAINE 1/10 % EPIDURAL INFUSION (WH - ANES)
14.0000 mL/h | INTRAMUSCULAR | Status: DC | PRN
  Administered 2016-03-16: 14 mL/h via EPIDURAL
  Filled 2016-03-16: qty 100

## 2016-03-16 MED ORDER — OXYTOCIN BOLUS FROM INFUSION
500.0000 mL | Freq: Once | INTRAVENOUS | Status: AC
  Administered 2016-03-17: 500 mL via INTRAVENOUS

## 2016-03-16 MED ORDER — FLEET ENEMA 7-19 GM/118ML RE ENEM: 1.0000 | ENEMA | Freq: Every day | RECTAL | Status: DC | PRN

## 2016-03-16 MED ORDER — LIDOCAINE HCL (PF) 1 % IJ SOLN
30.0000 mL | INTRAMUSCULAR | Status: DC | PRN
  Filled 2016-03-16: qty 30

## 2016-03-16 MED ORDER — OXYCODONE-ACETAMINOPHEN 5-325 MG PO TABS: 2.0000 | ORAL_TABLET | ORAL | Status: DC | PRN

## 2016-03-16 MED ORDER — ACETAMINOPHEN 325 MG PO TABS
650.0000 mg | ORAL_TABLET | ORAL | Status: DC | PRN
  Filled 2016-03-16: qty 2

## 2016-03-16 MED ORDER — LACTATED RINGERS IV SOLN
INTRAVENOUS | Status: DC
  Administered 2016-03-16 (×2): via INTRAVENOUS

## 2016-03-16 MED ORDER — ONDANSETRON HCL 4 MG/2ML IJ SOLN
4.0000 mg | Freq: Four times a day (QID) | INTRAMUSCULAR | Status: DC | PRN
  Filled 2016-03-16: qty 2

## 2016-03-16 MED ORDER — PHENYLEPHRINE 40 MCG/ML (10ML) SYRINGE FOR IV PUSH (FOR BLOOD PRESSURE SUPPORT)
80.0000 ug | PREFILLED_SYRINGE | INTRAVENOUS | Status: DC | PRN
  Filled 2016-03-16: qty 5

## 2016-03-16 MED ORDER — FENTANYL CITRATE (PF) 100 MCG/2ML IJ SOLN: 100.0000 ug | INTRAMUSCULAR | Status: DC | PRN

## 2016-03-16 MED ORDER — EPHEDRINE 5 MG/ML INJ: 10.0000 mg | INTRAVENOUS | Status: DC | PRN

## 2016-03-16 MED ORDER — OXYCODONE-ACETAMINOPHEN 5-325 MG PO TABS: 1.0000 | ORAL_TABLET | ORAL | Status: DC | PRN

## 2016-03-16 MED ORDER — DIPHENHYDRAMINE HCL 50 MG/ML IJ SOLN
12.5000 mg | INTRAMUSCULAR | Status: DC | PRN
Start: 2016-03-16 — End: 2016-03-17

## 2016-03-16 MED ORDER — EPHEDRINE 5 MG/ML INJ
10.0000 mg | INTRAVENOUS | Status: DC | PRN
  Filled 2016-03-16: qty 4

## 2016-03-16 MED ORDER — TERBUTALINE SULFATE 1 MG/ML IJ SOLN
0.2500 mg | Freq: Once | INTRAMUSCULAR | Status: DC | PRN
  Filled 2016-03-16: qty 1

## 2016-03-16 MED ORDER — LACTATED RINGERS IV SOLN: 500.0000 mL | Freq: Once | INTRAVENOUS | Status: DC

## 2016-03-16 MED ORDER — SOD CITRATE-CITRIC ACID 500-334 MG/5ML PO SOLN: 30.0000 mL | ORAL | Status: DC | PRN

## 2016-03-16 MED ORDER — OXYTOCIN 40 UNITS IN LACTATED RINGERS INFUSION - SIMPLE MED
2.5000 [IU]/h | INTRAVENOUS | Status: DC
  Administered 2016-03-17: 2.5 [IU]/h via INTRAVENOUS
  Filled 2016-03-16: qty 1000

## 2016-03-16 MED ORDER — MISOPROSTOL 25 MCG QUARTER TABLET
25.0000 ug | ORAL_TABLET | ORAL | Status: DC | PRN
  Filled 2016-03-16: qty 1
  Filled 2016-03-16: qty 0.25

## 2016-03-16 MED ORDER — PHENYLEPHRINE 40 MCG/ML (10ML) SYRINGE FOR IV PUSH (FOR BLOOD PRESSURE SUPPORT): 80.0000 ug | PREFILLED_SYRINGE | INTRAVENOUS | Status: DC | PRN

## 2016-03-16 NOTE — Anesthesia Preprocedure Evaluation (Signed)
Anesthesia Evaluation  Patient identified by MRN, date of birth, ID band Patient awake    Reviewed: Allergy & Precautions, Patient's Chart, lab work & pertinent test results  Airway Mallampati: II       Dental  (+) Teeth Intact   Pulmonary    breath sounds clear to auscultation       Cardiovascular hypertension,  Rhythm:Regular Rate:Normal     Neuro/Psych negative neurological ROS  negative psych ROS   GI/Hepatic negative GI ROS, Neg liver ROS,   Endo/Other  negative endocrine ROS  Renal/GU negative Renal ROS  negative genitourinary   Musculoskeletal negative musculoskeletal ROS (+)   Abdominal   Peds negative pediatric ROS (+)  Hematology negative hematology ROS (+)   Anesthesia Other Findings   Reproductive/Obstetrics (+) Pregnancy                             Lab Results  Component Value Date   WBC 6.8 03/16/2016   HGB 11.7 (L) 03/16/2016   HCT 36.4 03/16/2016   MCV 71.4 (L) 03/16/2016   PLT 161 03/16/2016   No results found for: INR, PROTIME   Anesthesia Physical Anesthesia Plan  ASA: II  Anesthesia Plan: Epidural   Post-op Pain Management:    Induction:   Airway Management Planned:   Additional Equipment:   Intra-op Plan:   Post-operative Plan:   Informed Consent: I have reviewed the patients History and Physical, chart, labs and discussed the procedure including the risks, benefits and alternatives for the proposed anesthesia with the patient or authorized representative who has indicated his/her understanding and acceptance.     Plan Discussed with:   Anesthesia Plan Comments:         Anesthesia Quick Evaluation

## 2016-03-16 NOTE — Progress Notes (Signed)
Patient ID: Tammy Perry, female   DOB: 05/20/1980, 35 y.o.   MRN: [id number]  Tammy Perry is a 35 y.o. G3P2002 at 2930w0d admitted for induction of labor due to pre-e w/o severe features.  Subjective: Uncomfortable w/ uc's, got iv fentanyl x 1, requesting epidural. Had gush of clear fluid when coming back from bathroom. Denies ha, visual changes, ruq/epigastric pain, n/v.    Objective: BP (!) 148/98   Pulse 76   Temp 98.7 F (37.1 C)   Resp 17   Ht 5\' 6"  (1.676 m)   Wt 95.7 kg (211 lb)   LMP 07/21/2015 (Approximate)   SpO2 96%   BMI 34.06 kg/m  No intake/output data recorded.  FHT:  FHR: 165 bpm, variability: moderate,  accelerations:  Present,  decelerations:  Absent UC:   regular, every 2 minutes  SVE:   Dilation: 6.5 Effacement (%): 80 Station: -2 Exam by:: Joellyn HaffKim Siarra Gilkerson, CNM No bag felt  Foley bulb sitting in vagina, removed w/o difficulty  Labs: Lab Results  Component Value Date   WBC 6.8 03/16/2016   HGB 11.7 (L) 03/16/2016   HCT 36.4 03/16/2016   MCV 71.4 (L) 03/16/2016   PLT 161 03/16/2016    Assessment / Plan: IOL d/t pre-e w/o severe features, foley bulb out, likely srom, active labor, requesting epidural  Labor: Progressing normally Fetal Wellbeing:  Category I Pain Control:  IV pain meds and requesting epidural Pre-eclampsia: bp's stable, asymptomatic I/D:  n/a Anticipated MOD:  VBAC  Marge DuncansBooker, Tammy Perry Randall CNM, WHNP-BC 03/16/2016, 10:12 PM

## 2016-03-16 NOTE — H&P (Signed)
LABOR AND DELIVERY ADMISSION HISTORY AND PHYSICAL NOTE  Tammy Perry is a 35 y.o. female 613P2002 with IUP at 4732w0d by u/s presenting for IOL, mild pre-eclampsia.   She reports positive fetal movement. She denies leakage of fluid or vaginal bleeding.  Prenatal History/Complications: Mild pre-eclampsia, asymptomatic.  Anticipate TOLAC.  History of C-section and successful VBAC .   Past Medical History: Past Medical History:  Diagnosis Date  . Medical history non-contributory   . Nausea 08/20/2015  . Pregnant 08/20/2015    Past Surgical History: Past Surgical History:  Procedure Laterality Date  . CESAREAN SECTION      Obstetrical History: OB History    Gravida Para Term Preterm AB Living   3 2 2     2    SAB TAB Ectopic Multiple Live Births           2     Social History: Social History   Social History  . Marital status: Single    Spouse name: N/A  . Number of children: N/A  . Years of education: N/A   Social History Main Topics  . Smoking status: Never Smoker  . Smokeless tobacco: Never Used  . Alcohol use No  . Drug use: No  . Sexual activity: Yes    Birth control/ protection: None   Other Topics Concern  . None   Social History Narrative  . None   Family History: Family History  Problem Relation Age of Onset  . Cancer Mother     lung  . Hypertension Mother   . Hyperlipidemia Mother   . Asthma Son   . Diabetes Maternal Uncle    Allergies: No Known Allergies  Prescriptions Prior to Admission  Medication Sig Dispense Refill Last Dose  . prenatal vitamin w/FE, FA (PRENATAL 1 + 1) 27-1 MG TABS tablet Take 1 tablet by mouth daily at 12 noon. 30 each 11 Taking   Review of Systems   All systems reviewed and negative except as stated in HPI  Blood pressure 133/86, pulse 91, temperature 99.2 F (37.3 C), temperature source Oral, resp. rate 16, height 5\' 6"  (1.676 m), weight 211 lb (95.7 kg), last menstrual period 07/21/2015, SpO2 96 %. General  appearance: alert, cooperative and appears stated age Lungs: clear to auscultation bilaterally, normal work of breathing on RA Heart: regular rate and rhythm, no MRG Abdomen: soft, non-tender; bowel sounds normal Extremities: No calf swelling or tenderness Presentation: cephalic Fetal monitoring: baseline 150s, mod variability, +accels, no decels Uterine activity: n/a Dilation: 1.5 Effacement (%): 10 Station: -3 Exam by:: Mary SwazilandJordan Johnson, RN  Prenatal labs: ABO, Rh: O/Positive/-- (05/17 1149) Antibody: Negative (09/13 0922) Rubella: !Error! RPR: Non Reactive (09/13 0922)  HBsAg: Negative (05/17 1149)  HIV: Non Reactive (09/13 0922)  GBS: CANCELED (11/17 1300) , reordered on admission 1 hr Glucola: 82/110/80 Genetic screening:  declines Anatomy US: normal  Prenatal Transfer Tool  Maternal Diabetes: No Genetic Screening: declined Maternal Ultrasounds/Referrals: Normal Fetal Ultrasounds or other Referrals:  None Maternal Substance Abuse:  No Significant Maternal Medications:  None Significant Maternal Lab Results: None  Results for orders placed or performed during the hospital encounter of 03/16/16 (from the past 24 hour(s))  Comprehensive metabolic panel   Collection Time: 03/16/16  1:35 PM  Result Value Ref Range   Sodium 133 (L) 135 - 145 mmol/L   Potassium 4.0 3.5 - 5.1 mmol/L   Chloride 104 101 - 111 mmol/L   CO2 20 (L) 22 - 32 mmol/L  Glucose, Bld 98 65 - 99 mg/dL   BUN 14 6 - 20 mg/dL   Creatinine, Ser 1.610.94 0.44 - 1.00 mg/dL   Calcium 9.0 8.9 - 09.610.3 mg/dL   Total Protein 6.2 (L) 6.5 - 8.1 g/dL   Albumin 3.1 (L) 3.5 - 5.0 g/dL   AST 33 15 - 41 U/L   ALT 18 14 - 54 U/L   Alkaline Phosphatase 151 (H) 38 - 126 U/L   Total Bilirubin 0.2 (L) 0.3 - 1.2 mg/dL   GFR calc non Af Amer >60 >60 mL/min   GFR calc Af Amer >60 >60 mL/min   Anion gap 9 5 - 15  Results for orders placed or performed in visit on 03/16/16 (from the past 24 hour(s))  POCT urinalysis  dipstick   Collection Time: 03/16/16  8:58 AM  Result Value Ref Range   Color, UA     Clarity, UA     Glucose, UA neg    Bilirubin, UA     Ketones, UA neg    Spec Grav, UA     Blood, UA neg    pH, UA     Protein, UA 4    Urobilinogen, UA     Nitrite, UA neg    Leukocytes, UA Negative Negative   Patient Active Problem List   Diagnosis Date Noted  . Preeclampsia 03/16/2016  . Supervision of normal pregnancy 09/10/2015  . Previous cesarean delivery affecting pregnancy, antepartum 09/10/2015  . Hx successful VBAC (vaginal birth after cesarean), currently pregnant 09/10/2015  . Nausea 08/20/2015   Assessment: Toma DeitersKatrina A Perry is a 35 y.o. G3P2002 at 7327w0d here for IOL for mild pre-eclampsia.    #Labor: IOL with Pitocin, Foley bulb.  Anticipate NSVD (TOLAC).  #Pain: Epidural on request #FWB:  Cat I #ID:  GBS previously cancelled.  Reordered on this admission, pending.  #MOF: Bottle #MOC: Undecided #Circ:  N/A #PreE: no s/s severe disease, though labs pending. #TOLAC: hx prior successful vbac; consent signed; risks/benefits reviewed with patient  Freddrick MarchYashika Amin, MD PGY-1 03/16/2016, 2:19 PM

## 2016-03-16 NOTE — Anesthesia Procedure Notes (Signed)
Epidural Patient location during procedure: OB Start time: 03/16/2016 10:39 PM End time: 03/16/2016 10:45 PM  Staffing Anesthesiologist: Shona SimpsonHOLLIS, Tammy Schafer D Performed: anesthesiologist   Preanesthetic Checklist Completed: patient identified, site marked, surgical consent, pre-op evaluation, timeout performed, IV checked, risks and benefits discussed and monitors and equipment checked  Epidural Patient position: sitting Prep: ChloraPrep Patient monitoring: heart rate, continuous pulse ox and blood pressure Approach: midline Location: L3-L4 Injection technique: LOR saline  Needle:  Needle type: Tuohy  Needle gauge: 17 G Needle length: 9 cm Catheter type: closed end flexible Catheter size: 20 Guage Test dose: negative and 1.5% lidocaine  Assessment Events: blood not aspirated, injection not painful, no injection resistance and no paresthesia  Additional Notes LOR @ 6  Patient identified. Risks/Benefits/Options discussed with patient including but not limited to bleeding, infection, nerve damage, paralysis, failed block, incomplete pain control, headache, blood pressure changes, nausea, vomiting, reactions to medications, itching and postpartum back pain. Confirmed with bedside nurse the patient's most recent platelet count. Confirmed with patient that they are not currently taking any anticoagulation, have any bleeding history or any family history of bleeding disorders. Patient expressed understanding and wished to proceed. All questions were answered. Sterile technique was used throughout the entire procedure. Please see nursing notes for vital signs. Test dose was given through epidural catheter and negative prior to continuing to dose epidural or start infusion. Warning signs of high block given to the patient including shortness of breath, tingling/numbness in hands, complete motor block, or any concerning symptoms with instructions to call for help. Patient was given instructions on  fall risk and not to get out of bed. All questions and concerns addressed with instructions to call with any issues or inadequate analgesia.    Reason for block:procedure for pain

## 2016-03-16 NOTE — Anesthesia Pain Management Evaluation Note (Signed)
  CRNA Pain Management Visit Note  Patient: Tammy DeitersKatrina A Kepner, 35 y.o., female  "Hello I am a member of the anesthesia team at [facility]. We have an anesthesia team available at all times to provide care throughout the hospital, including epidural management and anesthesia for C-section. I don't know your plan for the delivery whether it a natural birth, water birth, IV sedation, nitrous supplementation, doula or epidural, but we want to meet your pain goals."   1.Was your pain managed to your expectations on prior hospitalizations?   Yes   2.What is your expectation for pain management during this hospitalization?     Natural childbirth  3.How can we help you reach that goal? Natural birth  Record the patient's initial score and the patient's pain goal.   Pain: 1  Pain Goal: 10 [facility] wants you to be able to say your pain was always managed very well.  Rica RecordsICKELTON,Leibish Mcgregor 03/16/2016

## 2016-03-16 NOTE — Progress Notes (Signed)
Fetal Surveillance Testing today:  FHR 145   High Risk Pregnancy Diagnosis(es):   Proteinuria, now diagnosed with mild pre eclampsia  G3P2002 6840w0d Estimated Date of Delivery: 04/06/16  Blood pressure 130/90, pulse 76, weight 211 lb (95.7 kg), last menstrual period 07/21/2015.  Urinalysis: Positive for 4+ protein   HPI: The patient is being seen today for ongoing management of as above. Today she reports no headache or other CNS symptoms   BP weight and urine results all reviewed and noted. Patient reports good fetal movement, denies any bleeding and no rupture of membranes symptoms or regular contractions.  Fundal Height:  37 Fetal Heart rate:  145 Edema:  1+, and non dependent in hands and face  Patient is without complaints other than noted in her HPI. All questions were answered.  All lab and sonogram results have been reviewed. Comments:    Assessment:  1.  Pregnancy at 2540w0d,  Estimated Date of Delivery: 04/06/16 :                          2.  Mild preeclapmsia                        3.    Medication(s) Plans:  none  Treatment Plan:  Pt has a bit of an unusual presentation.  Her proteinuria is more significant than her lagging indicator of BP.  However with a diastolic of 90, 4+ protein(24 hour from 11/15 was 671 mg in 24 hours) and a platelet count dropping from 177K to 128K clinically appropriate to go ahead and deliver,  Discussed with Dr Macon LargeAnyanwu who agrees with proceeding with delivery.  Pt is s/p c section with hr first child and has had a successful VBAC.  VBAC/TOLAC consent signed 10/08/15  Return in about 9 days (around 03/25/2016) for BP check. for appointment for high risk OB care  No orders of the defined types were placed in this encounter.  Orders Placed This Encounter  Procedures  . POCT urinalysis dipstick

## 2016-03-17 ENCOUNTER — Encounter (HOSPITAL_COMMUNITY): Payer: Self-pay | Admitting: *Deleted

## 2016-03-17 ENCOUNTER — Encounter (HOSPITAL_COMMUNITY): Payer: Self-pay | Admitting: Anesthesiology

## 2016-03-17 DIAGNOSIS — O4593 Premature separation of placenta, unspecified, third trimester: Secondary | ICD-10-CM

## 2016-03-17 DIAGNOSIS — Z3A37 37 weeks gestation of pregnancy: Secondary | ICD-10-CM

## 2016-03-17 DIAGNOSIS — O34211 Maternal care for low transverse scar from previous cesarean delivery: Secondary | ICD-10-CM

## 2016-03-17 DIAGNOSIS — O1404 Mild to moderate pre-eclampsia, complicating childbirth: Secondary | ICD-10-CM

## 2016-03-17 DIAGNOSIS — O85 Puerperal sepsis: Secondary | ICD-10-CM

## 2016-03-17 LAB — CBC WITH DIFFERENTIAL/PLATELET
BASOS ABS: 0 10*3/uL (ref 0.0–0.1)
Basophils Relative: 0 %
EOS ABS: 0 10*3/uL (ref 0.0–0.7)
EOS PCT: 0 %
HCT: 30.6 % — ABNORMAL LOW (ref 36.0–46.0)
HEMOGLOBIN: 10.2 g/dL — AB (ref 12.0–15.0)
LYMPHS PCT: 14 %
Lymphs Abs: 2.6 10*3/uL (ref 0.7–4.0)
MCH: 24.4 pg — ABNORMAL LOW (ref 26.0–34.0)
MCHC: 33.3 g/dL (ref 30.0–36.0)
MCV: 73.2 fL — AB (ref 78.0–100.0)
Monocytes Absolute: 1 10*3/uL (ref 0.1–1.0)
Monocytes Relative: 5 %
NEUTROS PCT: 81 %
Neutro Abs: 15.5 10*3/uL — ABNORMAL HIGH (ref 1.7–7.7)
PLATELETS: 97 10*3/uL — AB (ref 150–400)
RBC: 4.18 MIL/uL (ref 3.87–5.11)
RDW: 16 % — ABNORMAL HIGH (ref 11.5–15.5)
WBC: 19.2 10*3/uL — AB (ref 4.0–10.5)

## 2016-03-17 LAB — CBC
HCT: 36.3 % (ref 36.0–46.0)
HEMATOCRIT: 34.8 % — AB (ref 36.0–46.0)
HEMATOCRIT: 29.3 % — AB (ref 36.0–46.0)
HEMOGLOBIN: 11.9 g/dL — AB (ref 12.0–15.0)
HEMOGLOBIN: 9.7 g/dL — AB (ref 12.0–15.0)
Hemoglobin: 11.5 g/dL — ABNORMAL LOW (ref 12.0–15.0)
MCH: 23.4 pg — ABNORMAL LOW (ref 26.0–34.0)
MCH: 24.1 pg — AB (ref 26.0–34.0)
MCH: 24.1 pg — ABNORMAL LOW (ref 26.0–34.0)
MCHC: 32.8 g/dL (ref 30.0–36.0)
MCHC: 33 g/dL (ref 30.0–36.0)
MCHC: 33.1 g/dL (ref 30.0–36.0)
MCV: 71.3 fL — ABNORMAL LOW (ref 78.0–100.0)
MCV: 73 fL — AB (ref 78.0–100.0)
MCV: 72.9 fL — AB (ref 78.0–100.0)
Platelets: 142 10*3/uL — ABNORMAL LOW (ref 150–400)
Platelets: 143 10*3/uL — ABNORMAL LOW (ref 150–400)
Platelets: 103 10*3/uL — ABNORMAL LOW (ref 150–400)
RBC: 5.09 MIL/uL (ref 3.87–5.11)
RBC: 4.77 MIL/uL (ref 3.87–5.11)
RBC: 4.02 MIL/uL (ref 3.87–5.11)
RDW: 15.5 % (ref 11.5–15.5)
RDW: 15.9 % — AB (ref 11.5–15.5)
RDW: 16.1 % — ABNORMAL HIGH (ref 11.5–15.5)
WBC: 10.8 10*3/uL — AB (ref 4.0–10.5)
WBC: 23.5 10*3/uL — ABNORMAL HIGH (ref 4.0–10.5)
WBC: 19.2 10*3/uL — AB (ref 4.0–10.5)

## 2016-03-17 LAB — COMPREHENSIVE METABOLIC PANEL
ALBUMIN: 2.4 g/dL — AB (ref 3.5–5.0)
ALK PHOS: 107 U/L (ref 38–126)
ALT: 17 U/L (ref 14–54)
ANION GAP: 7 (ref 5–15)
AST: 36 U/L (ref 15–41)
BUN: 12 mg/dL (ref 6–20)
CALCIUM: 8.1 mg/dL — AB (ref 8.9–10.3)
CO2: 19 mmol/L — AB (ref 22–32)
Chloride: 107 mmol/L (ref 101–111)
Creatinine, Ser: 1.01 mg/dL — ABNORMAL HIGH (ref 0.44–1.00)
GFR calc non Af Amer: >60 mL/min (ref >60)
GLUCOSE: 112 mg/dL — AB (ref 65–99)
POTASSIUM: 4.1 mmol/L (ref 3.5–5.1)
SODIUM: 133 mmol/L — AB (ref 135–145)
Total Bilirubin: 0.6 mg/dL (ref 0.3–1.2)
Total Protein: 4.9 g/dL — ABNORMAL LOW (ref 6.5–8.1)

## 2016-03-17 LAB — DIC (DISSEMINATED INTRAVASCULAR COAGULATION)PANEL
Fibrinogen: 322 mg/dL (ref 210–475)
Prothrombin Time: 13.6 seconds (ref 11.4–15.2)
Smear Review: NONE SEEN
aPTT: 26 seconds (ref 24–36)

## 2016-03-17 LAB — LACTIC ACID, PLASMA
LACTIC ACID, VENOUS: 2.1 mmol/L — AB (ref 0.5–1.9)
LACTIC ACID, VENOUS: 1.4 mmol/L (ref 0.5–1.9)
Lactic Acid, Venous: 3 mmol/L (ref 0.5–1.9)
Lactic Acid, Venous: 2.1 mmol/L (ref 0.5–1.9)

## 2016-03-17 LAB — RPR: RPR: NONREACTIVE

## 2016-03-17 LAB — DIC (DISSEMINATED INTRAVASCULAR COAGULATION) PANEL
D DIMER QUANT: 5.21 ug{FEU}/mL — AB (ref 0.00–0.50)
INR: 1.04
PLATELETS: 140 10*3/uL — AB (ref 150–400)

## 2016-03-17 LAB — POSTPARTUM HEMORRHAGE PROTOCOL (BB NOTIFICATION)

## 2016-03-17 LAB — PREPARE RBC (CROSSMATCH)

## 2016-03-17 MED ORDER — SIMETHICONE 80 MG PO CHEW: 80.0000 mg | CHEWABLE_TABLET | ORAL | Status: DC | PRN

## 2016-03-17 MED ORDER — SENNOSIDES-DOCUSATE SODIUM 8.6-50 MG PO TABS
2.0000 | ORAL_TABLET | ORAL | Status: DC
  Administered 2016-03-19: 2 via ORAL
  Filled 2016-03-17: qty 2

## 2016-03-17 MED ORDER — CARBOPROST TROMETHAMINE 250 MCG/ML IM SOLN: 250.0000 ug | INTRAMUSCULAR | Status: DC | PRN

## 2016-03-17 MED ORDER — SODIUM CHLORIDE 0.9% FLUSH
3.0000 mL | Freq: Two times a day (BID) | INTRAVENOUS | Status: DC
  Administered 2016-03-17: 3 mL via INTRAVENOUS

## 2016-03-17 MED ORDER — LACTATED RINGERS IV SOLN
INTRAVENOUS | Status: DC
  Administered 2016-03-17 – 2016-03-18 (×2): via INTRAVENOUS
  Administered 2016-03-18: 500 mL via INTRAVENOUS

## 2016-03-17 MED ORDER — DIPHENOXYLATE-ATROPINE 2.5-0.025 MG PO TABS
2.0000 | ORAL_TABLET | Freq: Once | ORAL | Status: DC
  Filled 2016-03-17: qty 2

## 2016-03-17 MED ORDER — COCONUT OIL OIL: 1.0000 "application " | TOPICAL_OIL | Status: DC | PRN

## 2016-03-17 MED ORDER — IBUPROFEN 600 MG PO TABS
600.0000 mg | ORAL_TABLET | Freq: Four times a day (QID) | ORAL | Status: DC
  Administered 2016-03-17 – 2016-03-19 (×8): 600 mg via ORAL
  Filled 2016-03-17 (×8): qty 1

## 2016-03-17 MED ORDER — ZOLPIDEM TARTRATE 5 MG PO TABS: 5.0000 mg | ORAL_TABLET | Freq: Every evening | ORAL | Status: DC | PRN

## 2016-03-17 MED ORDER — BENZOCAINE-MENTHOL 20-0.5 % EX AERO: 1.0000 "application " | INHALATION_SPRAY | CUTANEOUS | Status: DC | PRN

## 2016-03-17 MED ORDER — MISOPROSTOL 200 MCG PO TABS
ORAL_TABLET | ORAL | Status: AC
  Filled 2016-03-17: qty 5

## 2016-03-17 MED ORDER — OXYTOCIN 40 UNITS IN LACTATED RINGERS INFUSION - SIMPLE MED
10.0000 [IU]/h | INTRAVENOUS | Status: DC
  Administered 2016-03-17: 10 [IU]/h via INTRAVENOUS
  Filled 2016-03-17: qty 1000

## 2016-03-17 MED ORDER — DIPHENHYDRAMINE HCL 25 MG PO CAPS: 25.0000 mg | ORAL_CAPSULE | Freq: Four times a day (QID) | ORAL | Status: DC | PRN

## 2016-03-17 MED ORDER — METHYLERGONOVINE MALEATE 0.2 MG/ML IJ SOLN
0.2000 mg | Freq: Once | INTRAMUSCULAR | Status: AC
  Administered 2016-03-17: 0.2 mg via INTRAMUSCULAR

## 2016-03-17 MED ORDER — WITCH HAZEL-GLYCERIN EX PADS: 1.0000 "application " | MEDICATED_PAD | CUTANEOUS | Status: DC | PRN

## 2016-03-17 MED ORDER — DIBUCAINE 1 % RE OINT: 1.0000 "application " | TOPICAL_OINTMENT | RECTAL | Status: DC | PRN

## 2016-03-17 MED ORDER — LACTATED RINGERS IV BOLUS (SEPSIS): 1000.0000 mL | Freq: Once | INTRAVENOUS | Status: DC

## 2016-03-17 MED ORDER — MEASLES, MUMPS & RUBELLA VAC ~~LOC~~ INJ
0.5000 mL | INJECTION | Freq: Once | SUBCUTANEOUS | Status: DC
  Filled 2016-03-17: qty 0.5

## 2016-03-17 MED ORDER — ONDANSETRON HCL 4 MG/2ML IJ SOLN: 4.0000 mg | INTRAMUSCULAR | Status: DC | PRN

## 2016-03-17 MED ORDER — BISACODYL 10 MG RE SUPP
10.0000 mg | Freq: Every day | RECTAL | Status: DC | PRN
Start: 2016-03-17 — End: 2016-03-19

## 2016-03-17 MED ORDER — PIPERACILLIN-TAZOBACTAM 3.375 G IVPB
3.3750 g | Freq: Three times a day (TID) | INTRAVENOUS | Status: DC
  Administered 2016-03-17 – 2016-03-19 (×5): 3.375 g via INTRAVENOUS
  Filled 2016-03-17 (×8): qty 50

## 2016-03-17 MED ORDER — ACETAMINOPHEN 325 MG PO TABS
650.0000 mg | ORAL_TABLET | ORAL | Status: DC | PRN
  Administered 2016-03-17: 650 mg via ORAL

## 2016-03-17 MED ORDER — FLEET ENEMA 7-19 GM/118ML RE ENEM: 1.0000 | ENEMA | Freq: Every day | RECTAL | Status: DC | PRN

## 2016-03-17 MED ORDER — SODIUM CHLORIDE 0.9 % IV SOLN: Freq: Once | INTRAVENOUS | Status: DC

## 2016-03-17 MED ORDER — ONDANSETRON HCL 4 MG PO TABS: 4.0000 mg | ORAL_TABLET | ORAL | Status: DC | PRN

## 2016-03-17 MED ORDER — PRENATAL MULTIVITAMIN CH
1.0000 | ORAL_TABLET | Freq: Every day | ORAL | Status: DC
  Administered 2016-03-19: 1 via ORAL
  Filled 2016-03-17 (×2): qty 1

## 2016-03-17 MED ORDER — TETANUS-DIPHTH-ACELL PERTUSSIS 5-2.5-18.5 LF-MCG/0.5 IM SUSP: 0.5000 mL | Freq: Once | INTRAMUSCULAR | Status: DC

## 2016-03-17 MED ORDER — LACTATED RINGERS IV BOLUS (SEPSIS)
1000.0000 mL | Freq: Once | INTRAVENOUS | Status: AC
  Administered 2016-03-17: 1000 mL via INTRAVENOUS

## 2016-03-17 MED ORDER — SODIUM CHLORIDE 0.9% FLUSH: 3.0000 mL | INTRAVENOUS | Status: DC | PRN

## 2016-03-17 MED ORDER — SODIUM CHLORIDE 0.9 % IV SOLN: 250.0000 mL | INTRAVENOUS | Status: DC | PRN

## 2016-03-17 MED ORDER — PIPERACILLIN-TAZOBACTAM 3.375 G IVPB 30 MIN
3.3750 g | Freq: Once | INTRAVENOUS | Status: AC
  Administered 2016-03-17: 3.375 g via INTRAVENOUS
  Filled 2016-03-17: qty 50

## 2016-03-17 MED ORDER — MISOPROSTOL 200 MCG PO TABS
1000.0000 ug | ORAL_TABLET | Freq: Once | ORAL | Status: AC
  Administered 2016-03-17: 1000 ug via RECTAL

## 2016-03-17 NOTE — Progress Notes (Signed)
Patient's temp at 0730 was 100.4, post blood transfusion. Rechecked at 0815 and temp was 101.1. Patient otherwise asymptomatic, vaginally bleeding WNL. MD notified Selena Batten(Kim).

## 2016-03-17 NOTE — Progress Notes (Signed)
UR chart review completed.  

## 2016-03-17 NOTE — Anesthesia Postprocedure Evaluation (Signed)
Anesthesia Post Note  Patient: Jerene A Donzetta MattersGalloway  Procedure(s) Performed: * No procedures listed *  Patient location during evaluation: Mother Baby Anesthesia Type: Epidural Level of consciousness: awake and alert Pain management: pain level controlled Vital Signs Assessment: post-procedure vital signs reviewed and stable Respiratory status: spontaneous breathing Cardiovascular status: blood pressure returned to baseline and stable Postop Assessment: no headache, no backache, epidural receding, patient able to bend at knees, no signs of nausea or vomiting and adequate PO intake Anesthetic complications: no     Last Vitals:  Vitals:   03/17/16 0710 03/17/16 0730  BP: 133/79 135/75  Pulse: 79 83  Resp: 18 18  Temp: 37.6 C (!) 38 C    Last Pain:  Vitals:   03/17/16 0730  TempSrc: Oral  PainSc:    Pain Goal: Patients Stated Pain Goal: 2 (03/16/16 2320)               Kailoni Vahle

## 2016-03-17 NOTE — Progress Notes (Signed)
Called 2030 lactic acid level to Dr. Sampson GoonFitzgerald. Order received to decrease IV fluid rate and d/c foley.

## 2016-03-17 NOTE — Progress Notes (Addendum)
Faculty Practice OB/GYN Attending Note  Subjective:  Called to evaluate patient with increased bleeding after recent VBAC. Had EBL of 700 ml after delivery; pitocin infusion going in and had Cytotec 1000 mcg placed rectally.  Patient has stable mental status, reports some pelvic cramping.     Objective:  Blood pressure 115/60, pulse 77, temperature 99.5 F (37.5 C), temperature source Oral, resp. rate 20, height 5\' 6"  (1.676 m), weight 211 lb (95.7 kg), last menstrual period 07/21/2015, SpO2 98 %, unknown if currently breastfeeding.    Patient Vitals for the past 24 hrs:  BP Temp Temp src Pulse Resp SpO2 Height Weight  03/17/16 0328 (!) 123/100 - - 85 - 99 % - -  03/17/16 0316 115/60 - - 77 - - - -  03/17/16 0315 - - - 83 - 98 % - -  03/17/16 0313 (!) 130/116 99.5 F (37.5 C) Oral 83 20 98 % - -  03/17/16 0312 (!) 130/116 - - 80 - 98 % - -  03/17/16 0311 (!) 130/116 - - 80 - - - -  03/17/16 0310 - - - 84 - 98 % - -  03/17/16 0216 100/78 - - 93 - - - -  03/17/16 0210 118/76 - - 86 - - - -  03/17/16 0206 103/80 - - (!) 161 - - - -  03/17/16 0201 117/64 - - 94 - - - -  03/17/16 0156 123/78 - - 91 - - - -  03/17/16 0153 135/78 - - (!) 123 - - - -  03/17/16 0146 110/68 - - 91 - - - -  03/17/16 0131 (!) 146/92 - - 89 - - - -  03/17/16 0104 (!) 154/88 - - 80 - - - -  03/17/16 0101 (!) 160/102 - - 84 - - - -  03/17/16 0031 (!) 156/85 - - 84 - - - -  03/17/16 0030 - 98.9 F (37.2 C) Oral - - - - -  03/17/16 0001 (!) 151/84 - - 66 - - - -  03/16/16 2330 (!) 143/93 - - 85 - - - -  03/16/16 2316 (!) 142/86 - - 82 - - - -  03/16/16 2311 - - - 87 - - - -  03/16/16 2305 (!) 148/88 - - 85 - 98 % - -  03/16/16 2301 - - - 82 - - - -  03/16/16 2300 (!) 144/89 - - 90 - 98 % - -  03/16/16 2258 136/85 - - 83 - 98 % - -  03/16/16 2256 136/85 - - 83 - - - -  03/16/16 2255 - - - 85 - 96 % - -  03/16/16 2253 - - - 85 18 96 % - -  03/16/16 2252 (!) 145/87 - - 90 - 98 % - -  03/16/16 2250 (!) 145/87  - - 90 - 98 % - -  03/16/16 2249 - - - 93 - - - -  03/16/16 2245 - - - 90 - 99 % - -  03/16/16 2243 (!) 147/95 - - 92 - - - -  03/16/16 2241 (!) 147/95 - - 95 - 98 % - -  03/16/16 2240 - - - 95 - 98 % - -  03/16/16 2235 - - - 97 - 98 % - -  03/16/16 2231 (!) 148/87 - - 87 - - - -  03/16/16 2230 (!) 148/87 - - 95 - 93 % - -  03/16/16 2225 - - -  96 - 99 % - -  03/16/16 2224 - 98.1 F (36.7 C) - 96 18 99 % - -  03/16/16 2200 (!) 153/94 - - 83 - - - -  03/16/16 2130 (!) 144/90 - - 86 - - - -  03/16/16 2100 (!) 147/88 - - 85 - - - -  03/16/16 2030 (!) 148/92 - - 89 - - - -  03/16/16 2017 - 98.7 F (37.1 C) - - 17 - - -  03/16/16 2000 (!) 148/98 - - 76 - - - -  03/16/16 1936 (!) 142/91 - - 80 - - - -  03/16/16 1930 (!) 158/94 - - 83 - - - -  03/16/16 1914 (!) 152/93 - - 68 - - - -  03/16/16 1901 (!) 139/93 - - 71 - - - -  03/16/16 1831 139/90 - - 75 - - - -  03/16/16 1801 (!) 142/80 - - 91 - - - -  03/16/16 1731 136/86 - - 75 - - - -  03/16/16 1700 135/88 99 F (37.2 C) Oral 75 17 - - -  03/16/16 1630 136/90 - - 96 - - - -  03/16/16 1604 138/88 - - 82 - - - -  03/16/16 1454 (!) 139/91 - - 81 - - - -  03/16/16 1441 (!) 142/93 - - 94 - - - -  03/16/16 1354 133/86 - - 91 16 - 5\' 6"  (1.676 m) 211 lb (95.7 kg)  03/16/16 1327 - 99.2 F (37.3 C) Oral - 17 - - -  03/16/16 1315 - - - (!) 111 - 96 % - -    Gen: NAD HENT: Normocephalic, atraumatic Lungs: Normal respiratory effort Heart: Tachycardia noted Abdomen: NT, fundus firm at level of umbilicus Ext: 2+ DTRs, no edema, no cyanosis Cervix/Bimanual:  ~750 ml of clots and red blood evacuated from lower uterine segment.  Code Hemorrhage activated.    Patient was given a dose of Methergine, and second IV line was started. BP noted to be as low as 70s/40s during the Code.  She reported feeling lightheaded.  2 units of pRBCs were ordered and transfusion was started immediately.  BP was noted to increase gradually.  Slow trickle noted on  subsequent fundal massage.   Results for orders placed or performed during the hospital encounter of 03/16/16 (from the past 24 hour(s))  Type and screen [facility]     Status: None (Preliminary result)   Collection Time: 03/16/16  1:30 PM  Result Value Ref Range   ABO/RH(D) O POS    Antibody Screen NEG    Sample Expiration 03/19/2016    Unit Number [id number]    Blood Component Type RED CELLS,LR    Unit division 00    Status of Unit ISSUED    Transfusion Status OK TO TRANSFUSE    Crossmatch Result Compatible    Unit Number [id number]    Blood Component Type RED CELLS,LR    Unit division 00    Status of Unit ALLOCATED    Transfusion Status OK TO TRANSFUSE    Crossmatch Result Compatible   CBC     Status: Abnormal   Collection Time: 03/16/16  1:33 PM  Result Value Ref Range   WBC 6.8 4.0 - 10.5 K/uL   RBC 5.10 3.87 - 5.11 MIL/uL   Hemoglobin 11.7 (L) 12.0 - 15.0 g/dL   HCT 30.836.4 65.736.0 - 84.646.0 %   MCV 71.4 (L) 78.0 - 100.0 fL  MCH 22.9 (L) 26.0 - 34.0 pg   MCHC 32.1 30.0 - 36.0 g/dL   RDW 62.915.3 52.811.5 - 41.315.5 %   Platelets 161 150 - 400 K/uL  Group B strep by PCR     Status: None   Collection Time: 03/16/16  1:35 PM  Result Value Ref Range   Group B strep by PCR NEGATIVE NEGATIVE  Comprehensive metabolic panel     Status: Abnormal   Collection Time: 03/16/16  1:35 PM  Result Value Ref Range   Sodium 133 (L) 135 - 145 mmol/L   Potassium 4.0 3.5 - 5.1 mmol/L   Chloride 104 101 - 111 mmol/L   CO2 20 (L) 22 - 32 mmol/L   Glucose, Bld 98 65 - 99 mg/dL   BUN 14 6 - 20 mg/dL   Creatinine, Ser 2.440.94 0.44 - 1.00 mg/dL   Calcium 9.0 8.9 - 01.010.3 mg/dL   Total Protein 6.2 (L) 6.5 - 8.1 g/dL   Albumin 3.1 (L) 3.5 - 5.0 g/dL   AST 33 15 - 41 U/L   ALT 18 14 - 54 U/L   Alkaline Phosphatase 151 (H) 38 - 126 U/L   Total Bilirubin 0.2 (L) 0.3 - 1.2 mg/dL   GFR calc non Af Amer >60 >60 mL/min   GFR calc Af Amer >60 >60 mL/min   Anion gap 9 5 - 15  CBC     Status:  Abnormal   Collection Time: 03/17/16  1:59 AM  Result Value Ref Range   WBC 10.8 (H) 4.0 - 10.5 K/uL   RBC 5.09 3.87 - 5.11 MIL/uL   Hemoglobin 11.9 (L) 12.0 - 15.0 g/dL   HCT 27.236.3 53.636.0 - 64.446.0 %   MCV 71.3 (L) 78.0 - 100.0 fL   MCH 23.4 (L) 26.0 - 34.0 pg   MCHC 32.8 30.0 - 36.0 g/dL   RDW 03.415.5 74.211.5 - 59.515.5 %   Platelets 142 (L) 150 - 400 K/uL  Initiate postpartum hemorrage protocol - BB notification     Status: None   Collection Time: 03/17/16  2:45 AM  Result Value Ref Range   Initiate PPH protocol (BB notified) PPH ORDER RECEIVED   Prepare RBC     Status: None   Collection Time: 03/17/16  2:50 AM  Result Value Ref Range   Order Confirmation ORDER PROCESSED BY BLOOD BANK       Assessment & Plan:  35 y.o. G3O7564G3P3003 s/p VBAC secondary to IOL for preeclampsia without severe features; delivery complicated by PPH of ~ 1500 ml, Code Hemorrhage activated.  Labs ordered, will recheck after transfusion.  Continue close observation. If bleeding continues, will proceed with further interventions.  Jaynie CollinsUGONNA  Aaryana Betke, MD, FACOG Attending Obstetrician & Gynecologist Faculty Practice, [facility]

## 2016-03-17 NOTE — Progress Notes (Signed)
Patient's lactic acid level 2.1, notified Dr. Alvester MorinNewton. See orders for orders taken.

## 2016-03-17 NOTE — Progress Notes (Signed)
Called Dr. Sampson GoonFitzgerald regarding pt. Reviewed 1730 lactic acid result and that 2030 result still pending. Pt currently receiving LR @ 14450ml/hr. Foley still in place. Will call MD back when lactic acid result available to further discuss plan of care (i.e. Foley and IV fluids.)

## 2016-03-18 ENCOUNTER — Encounter (HOSPITAL_COMMUNITY): Payer: Self-pay

## 2016-03-18 LAB — TYPE AND SCREEN
ABO/RH(D): O POS
ANTIBODY SCREEN: NEGATIVE
UNIT DIVISION: 0
UNIT DIVISION: 0

## 2016-03-18 NOTE — Progress Notes (Signed)
POSTPARTUM PROGRESS NOTE  Post Partum Day 1 Subjective:  Tammy Perry is a 35 y.o. [id number] 7427w1d s/p VBAC.  No acute events overnight.  Pt denies problems with ambulating, voiding or po intake.  She denies nausea or vomiting.  Pain is well controlled.  She has had flatus. She has had bowel movement.  Lochia Minimal.   Objective: Blood pressure 121/68, pulse 86, temperature 97.5 F (36.4 C), temperature source Oral, resp. rate 18, height 5\' 6"  (1.676 m), weight 211 lb (95.7 kg), last menstrual period 07/21/2015, SpO2 100 %, unknown if currently breastfeeding.  Physical Exam:  General: alert, cooperative and no distress Lochia:normal flow Chest: CTAB Heart: RRR no m/r/g Abdomen: +BS, soft, nontender,  Uterine Fundus: firm, intact at level of umbilicus DVT Evaluation: No calf swelling or tenderness Extremities: 1+ edema   Recent Labs  03/17/16 1128 03/17/16 1725  HGB 10.2* 9.7*  HCT 30.6* 29.3*    Assessment/Plan:  ASSESSMENT: Tammy Perry is a 35 y.o. [id number] 6727w1d s/p VBAC @130    Plan for discharge tomorrow and Contraception nexplanon   LOS: 2 days   Berniece SalinesWALLACE, NOAH I, DO PGY-3 [facility], [facility]  03/18/2016, 7:59 AM   Midwife attestation Post Partum Day 1 I have seen and examined this patient and agree with above documentation in the resident's note.   Tammy Perry is a 35 y.o. Q4O9629G3P3003 s/p VBAC.  Pt denies problems with ambulating, voiding or po intake. Pain is well controlled. Method of Feeding: bottle  PE:  BP 121/74 (BP Location: Left Arm)   Pulse 88   Temp 98.5 F (36.9 C) (Oral)   Resp 18   Ht 5\' 6"  (1.676 m)   Wt 95.7 kg (211 lb)   LMP 07/21/2015 (Approximate)   SpO2 100%   Breastfeeding? Unknown   BMI 34.06 kg/m  Gen: well appearing Heart: reg rate Lungs: normal WOB Fundus firm Ext: soft, no pain, no edema  A/Plan  PPD #1 PPH, s/p 2 u PRBCs-hgb stable, asymptomatic Preeclampsia,  delivered-stable Postpartum sepsis-WBC and lactic acid trending down Discontinue Zosyn Anticipate discharge tomorrow if afebrile  Donette LarryMelanie Neelie Welshans, CNM 8:44 PM

## 2016-03-19 MED ORDER — IBUPROFEN 600 MG PO TABS: 600.0000 mg | ORAL_TABLET | Freq: Four times a day (QID) | ORAL | 0 refills | Status: DC

## 2016-03-19 NOTE — Discharge Summary (Signed)
OB Discharge Summary     Patient Name: Tammy Perry DOB: 03-26-1981 MRN: [id number]  Date of admission: 03/16/2016 Delivering MD: Shawna Clamp R   Date of discharge: 03/19/2016  Admitting diagnosis: INDUCTION Intrauterine pregnancy: [redacted]w[redacted]d     Secondary diagnosis:  Principal Problem:   Preeclampsia Active Problems:   Previous cesarean delivery affecting pregnancy, antepartum   Hx successful VBAC (vaginal birth after cesarean), currently pregnant  Additional problems: severe features (BP) of preeclampsia     Discharge diagnosis: VBAC                                                                                                Post partum procedures:blood transfusion  Augmentation: Foley Balloon  Complications: Hemorrhage>1044mL; postpartum  sepsis  Hospital course:  Induction of Labor With Vaginal Delivery   35 y.o. yo G3P3003 at [redacted]w[redacted]d was admitted to the hospital 03/16/2016 for induction of labor.  Indication for induction: Preeclampsia.  Patient had an uncomplicated labor course as follows: Membrane Rupture Time/Date: 9:50 PM ,03/16/2016  , spontaneously.  Only received Foley Bulb for IOL Intrapartum Procedures: Episiotomy: None [1]                                         Lacerations:  None [1]  Patient had delivery of a Viable infant.  Information for the patient's newborn:  Promyce, Wixom [[id number]]  Delivery Method: Vaginal, Spontaneous Delivery (Filed from Delivery Summary)   03/17/2016  Details of delivery can be found in separate delivery note. She received 2uPRBC on PPD#0 for PPH~ 1500CC.  On PPD#1, she developed a fever and was placed on sepsis protocol d/t labs.  Received >24 hours of zoysn.  Afebrile > 24 hours.   Patient is discharged home 03/19/16.   Physical exam Vitals:   03/17/16 2110 03/18/16 0527 03/18/16 1813 03/19/16 0543  BP:  121/68 121/74 127/60  Pulse:  86 88 87  Resp:  18 18 18   Temp: 98 F (36.7 C) 97.5 F (36.4 C) 98.5  F (36.9 C) 98.2 F (36.8 C)  TempSrc: Oral Oral Oral Oral  SpO2:      Weight:      Height:       General: alert, cooperative and no distress Lochia: appropriate Uterine Fundus: firm Incision: N/A DVT Evaluation: No evidence of DVT seen on physical exam. Negative Homan's sign. No cords or calf tenderness. No significant calf/ankle edema. Labs: Lab Results  Component Value Date   WBC 19.2 (H) 03/17/2016   HGB 9.7 (L) 03/17/2016   HCT 29.3 (L) 03/17/2016   MCV 72.9 (L) 03/17/2016   PLT 103 (L) 03/17/2016   CMP Latest Ref Rng & Units 03/17/2016  Glucose 65 - 99 mg/dL 952(W)  BUN 6 - 20 mg/dL 12  Creatinine 4.13 - 2.44 mg/dL 0.10(U)  Sodium 725 - 366 mmol/L 133(L)  Potassium 3.5 - 5.1 mmol/L 4.1  Chloride 101 - 111 mmol/L 107  CO2 22 - 32 mmol/L 19(L)  Calcium 8.9 -  10.3 mg/dL 8.1(L)  Total Protein 6.5 - 8.1 g/dL 4.9(L)  Total Bilirubin 0.3 - 1.2 mg/dL 0.6  Alkaline Phos 38 - 126 U/L 107  AST 15 - 41 U/L 36  ALT 14 - 54 U/L 17    Discharge instruction: per After Visit Summary and "Baby and Me Booklet".  After visit meds:    Medication List    TAKE these medications   ibuprofen 600 MG tablet Commonly known as:  ADVIL,MOTRIN Take 1 tablet (600 mg total) by mouth every 6 (six) hours.   prenatal multivitamin Tabs tablet Take 1 tablet by mouth daily at 12 noon.       Diet: routine diet  Activity: Advance as tolerated. Pelvic rest for 6 weeks.   Outpatient follow up: Follow up Appt:Future Appointments Date Time Provider Department Center  03/25/2016 9:15 AM Lazaro Arms, MD [facility]   Follow up Visit:No Follow-up on file.  Postpartum contraception: Nexplanon  Newborn Data: Live born female  Birth Weight: 5 lb 6.4 oz (2450 g) APGAR: 6, 8  Baby Feeding: Bottle Disposition:home with mother   03/19/2016 Greig Right, CNM

## 2016-03-19 NOTE — Discharge Instructions (Signed)

## 2016-03-22 ENCOUNTER — Telehealth: Payer: Self-pay | Admitting: *Deleted

## 2016-03-22 LAB — CULTURE, BLOOD (ROUTINE X 2)
Culture: NO GROWTH
Culture: NO GROWTH

## 2016-03-22 NOTE — Anesthesia Postprocedure Evaluation (Signed)
Anesthesia Post Note  Patient: Tammy Perry  Procedure(s) Performed: * No procedures listed *  Patient location during evaluation: Mother Baby Anesthesia Type: Epidural Level of consciousness: awake and alert Pain management: pain level controlled Vital Signs Assessment: post-procedure vital signs reviewed and stable Respiratory status: spontaneous breathing, nonlabored ventilation and respiratory function stable Cardiovascular status: stable Postop Assessment: no headache, no backache and epidural receding Anesthetic complications: no    Last Vitals: There were no vitals filed for this visit.  Last Pain: There were no vitals filed for this visit.               Shelton SilvasKevin D Jagdeep Ancheta

## 2016-03-22 NOTE — Telephone Encounter (Signed)
[facility] pt delivered [facility]inally 03/17/2016, Hgb today 8.8. Pt encouraged to start taking the PNV daily again.

## 2016-03-23 ENCOUNTER — Encounter: Payer: Medicaid Other | Admitting: Obstetrics & Gynecology

## 2016-03-23 MED ORDER — FERROUS SULFATE 325 (65 FE) MG PO TABS: 325.0000 mg | ORAL_TABLET | Freq: Two times a day (BID) | ORAL | 3 refills | Status: DC

## 2016-03-23 NOTE — Telephone Encounter (Signed)
Pt informed per Joellyn HaffKim Booker, CNM, Fe supplement e-scribed to [facility], [place], take daily with OJ, continue PNV daily, and increase Fe rich foods (red meat, beans, green leafy veg). Pt verbalized understanding.

## 2016-03-25 ENCOUNTER — Encounter: Payer: Medicaid Other | Admitting: Obstetrics & Gynecology

## 2016-03-25 ENCOUNTER — Encounter: Payer: Self-pay | Admitting: Obstetrics & Gynecology

## 2016-03-25 ENCOUNTER — Ambulatory Visit (INDEPENDENT_AMBULATORY_CARE_PROVIDER_SITE_OTHER): Payer: Medicaid Other | Admitting: Obstetrics & Gynecology

## 2016-03-25 VITALS — BP 150/90 | HR 80 | Wt 193.4 lb

## 2016-03-25 DIAGNOSIS — Z0130 Encounter for examination of blood pressure without abnormal findings: Secondary | ICD-10-CM

## 2016-03-25 NOTE — Progress Notes (Signed)
      Chief Complaint  Patient presents with  . Follow-up    bp check    Blood pressure (!) 150/90, pulse 80, weight 193 lb 6.4 oz (87.7 kg), unknown if currently breastfeeding.  35 y.o. [id number] No LMP recorded. The current method of family planning is none.  Outpatient Encounter Prescriptions as of 03/25/2016  Medication Sig  . ferrous sulfate 325 (65 FE) MG tablet Take 1 tablet (325 mg total) by mouth 2 (two) times daily with a meal.  . Prenatal Vit-Fe Fumarate-FA (PRENATAL MULTIVITAMIN) TABS tablet Take 1 tablet by mouth daily at 12 noon.  . [DISCONTINUED] ibuprofen (ADVIL,MOTRIN) 600 MG tablet Take 1 tablet (600 mg total) by mouth every 6 (six) hours.   No facility-administered encounter medications on file as of 03/25/2016.     Subjective Pt had pre eclampsia, here for 1 week pp BP check Blood pressure (!) 150/90, pulse 80, weight 193 lb 6.4 oz (87.7 kg), unknown if currently breastfeeding.    Objective No headaches or blurry vision  Pertinent ROS   Labs or studies     Impression Diagnoses this Encounter::   ICD-9-CM ICD-10-CM   1. BP check V81.1 Z01.30     Established relevant diagnosis(es): Pre eclampsia  Plan/Recommendations: No orders of the defined types were placed in this encounter.   Labs or Scans Ordered: No orders of the defined types were placed in this encounter.   Management:: Wants nexplanon  Follow up Return in about 5 weeks (around 04/29/2016) for post partum visit.        Face to face time:  10 minutes  Greater than 50% of the visit time was spent in counseling and coordination of care with the patient.  The summary and outline of the counseling and care coordination is summarized in the note above.   All questions were answered.  Past Medical History:  Diagnosis Date  . Medical history non-contributory   . Nausea 08/20/2015  . Pregnant 08/20/2015    Past Surgical History:  Procedure Laterality Date  . CESAREAN  SECTION      OB History    Gravida Para Term Preterm AB Living   3 3 3     3    SAB TAB Ectopic Multiple Live Births         0 3      No Known Allergies  Social History   Social History  . Marital status: Single    Spouse name: N/A  . Number of children: N/A  . Years of education: N/A   Social History Main Topics  . Smoking status: Never Smoker  . Smokeless tobacco: Never Used  . Alcohol use No  . Drug use: No  . Sexual activity: Yes    Birth control/ protection: None   Other Topics Concern  . None   Social History Narrative  . None    Family History  Problem Relation Age of Onset  . Cancer Mother     lung  . Hypertension Mother   . Hyperlipidemia Mother   . Asthma Son   . Diabetes Maternal Uncle

## 2016-04-22 ENCOUNTER — Ambulatory Visit: Payer: Medicaid Other

## 2016-04-27 ENCOUNTER — Ambulatory Visit: Payer: Medicaid Other | Admitting: Advanced Practice Midwife

## 2016-04-29 ENCOUNTER — Encounter: Payer: Medicaid Other | Admitting: Advanced Practice Midwife

## 2016-05-05 ENCOUNTER — Encounter: Payer: Self-pay | Admitting: Advanced Practice Midwife

## 2016-05-05 ENCOUNTER — Ambulatory Visit (INDEPENDENT_AMBULATORY_CARE_PROVIDER_SITE_OTHER): Payer: Medicaid Other | Admitting: Advanced Practice Midwife

## 2016-05-05 DIAGNOSIS — Z3200 Encounter for pregnancy test, result unknown: Secondary | ICD-10-CM

## 2016-05-05 NOTE — Progress Notes (Signed)
Tammy Perry is a 36 y.o. who presents for a postpartum visit. She is 6 weeks postpartum following a VBAC. I have fully reviewed the prenatal and intrapartum course. The delivery was at 37.1 gestational weeks. She had an IOL for Preeclampsia.  Had PPH of ~ 1500 cc, received 2 u PRBC, developed fever, received ABX IV.  Anesthesia: epidural. Postpartum course has been uneventful. Baby's course has been uneventful. Baby is feeding by bottle. Bleeding: no bleeding. Bowel function is normal. Bladder function is normal. Patient is sexually active.  Contraception method is none. Last intercourse 04/28/16 (may have been on period, but not sure) Postpartum depression screening: negative.   Current Outpatient Prescriptions:  .  Prenatal Vit-Fe Fumarate-FA (PRENATAL MULTIVITAMIN) TABS tablet, Take 1 tablet by mouth daily at 12 noon., Disp: , Rfl:  .  ferrous sulfate 325 (65 FE) MG tablet, Take 1 tablet (325 mg total) by mouth 2 (two) times daily with a meal. (Patient not taking: Reported on 05/05/2016), Disp: 60 tablet, Rfl: 3  Review of Systems   Constitutional: Negative for fever and chills Eyes: Negative for visual disturbances Respiratory: Negative for shortness of breath, dyspnea Cardiovascular: Negative for chest pain or palpitations  Gastrointestinal: Negative for vomiting, diarrhea and constipation Genitourinary: Negative for dysuria and urgency Musculoskeletal: Negative for back pain, joint pain, myalgias  Neurological: Negative for dizziness and headaches    Objective:     Vitals:   05/05/16 0908  BP: 111/72  Pulse: 81   General:  alert, cooperative and no distress   Breasts:  negative  Lungs: clear to auscultation bilaterally  Heart:  regular rate and rhythm  Abdomen: Soft, nontender   Vulva:  normal  Vagina: normal vagina  Cervix:  closed  Corpus: Well involuted     Rectal Exam: no hemorrhoids        Assessment:    normal postpartum exam. Preeclampsia, resolved Plan:    1. Contraception: Nexplanon 2. [facility] on Tuesday.  no sex until then.

## 2016-05-11 ENCOUNTER — Encounter: Payer: Medicaid Other | Admitting: Advanced Practice Midwife

## 2016-05-11 ENCOUNTER — Emergency Department (HOSPITAL_COMMUNITY)
Admission: EM | Admit: 2016-05-11 | Discharge: 2016-05-11 | Disposition: A | Discharge status: Home / Self Care | Payer: Medicaid Other | Attending: Emergency Medicine | Admitting: Emergency Medicine

## 2016-05-11 ENCOUNTER — Encounter (HOSPITAL_COMMUNITY): Payer: Self-pay | Admitting: Emergency Medicine

## 2016-05-11 DIAGNOSIS — L980 Pyogenic granuloma: Secondary | ICD-10-CM | POA: Diagnosis not present

## 2016-05-11 DIAGNOSIS — Z79899 Other long term (current) drug therapy: Secondary | ICD-10-CM | POA: Diagnosis not present

## 2016-05-11 DIAGNOSIS — M79644 Pain in right finger(s): Secondary | ICD-10-CM | POA: Diagnosis present

## 2016-05-11 LAB — BETA HCG QUANT (REF LAB): hCG Quant: <1 m[IU]/mL

## 2016-05-11 MED ORDER — IMIQUIMOD 5 % EX CREA: TOPICAL_CREAM | CUTANEOUS | 0 refills | Status: DC

## 2016-05-11 NOTE — ED Notes (Signed)
Pt verbalized understanding of discharge instructions. Pt ambulatory to waiting room.  

## 2016-05-11 NOTE — ED Notes (Signed)
Applied sterile dressing.

## 2016-05-11 NOTE — ED Triage Notes (Signed)
Raised area to skin, painful, red, pt states it started as a blister, getting worse

## 2016-05-11 NOTE — ED Provider Notes (Signed)
[facility]Y DEPT Provider Note   CSN: [id number] Arrival date & time: 05/11/16  1919     History   Chief Complaint [id number] Complaint  Patient presents with  . Blister    HPI Tammy Perry is a 36 y.o. female presenting with a somewhat tender growth on her right index finger present for about the past month.  She believes she may have  Sustained a puncture wound after which a blister formed which bled when it peeled but has since developed a pink fleshy growth at the site which continues to get larger.  She states it bleeds and is tender if bumped but otherwise does not bother her.  She has had no treatment prior to arrival.    The history is provided by the patient.    Past Medical History:  Diagnosis Date  . Medical history non-contributory   . Nausea 08/20/2015  . Pregnant 08/20/2015    Patient Active Problem List   Diagnosis Date Noted  . Preeclampsia 03/16/2016  . Previous cesarean delivery affecting pregnancy, antepartum 09/10/2015  . Hx successful VBAC (vaginal birth after cesarean), currently pregnant 09/10/2015  . Nausea 08/20/2015    Past Surgical History:  Procedure Laterality Date  . CESAREAN SECTION      OB History    Gravida Para Term Preterm AB Living   3 3 3     3    SAB TAB Ectopic Multiple Live Births         0 3       Home Medications    Prior to Admission medications   Medication Sig Start Date End Date Taking? Authorizing Provider  ferrous sulfate 325 (65 FE) MG tablet Take 1 tablet (325 mg total) by mouth 2 (two) times daily with a meal. Patient not taking: Reported on 05/05/2016 03/23/16   Cheral MarkerKimberly R Booker, CNM  imiquimod Mathis Dad(ALDARA) 5 % cream Apply topically 3 (three) times a week. 05/12/16   Burgess AmorJulie Cartez Mogle, PA-C  Prenatal Vit-Fe Fumarate-FA (PRENATAL MULTIVITAMIN) TABS tablet Take 1 tablet by mouth daily at 12 noon.    Historical Provider, MD    Family History Family History  Problem Relation Age of Onset  . Cancer Mother     lung    . Hypertension Mother   . Hyperlipidemia Mother   . Asthma Son   . Diabetes Maternal Uncle     Social History Social History  Substance Use Topics  . Smoking status: Never Smoker  . Smokeless tobacco: Never Used  . Alcohol use No     Allergies   Patient has no known allergies.   Review of Systems Review of Systems  Constitutional: Negative for chills and fever.  HENT: Negative.   Respiratory: Negative.   Cardiovascular: Negative.   Musculoskeletal: Negative.   Skin: Positive for wound.  Neurological: Negative for numbness.     Physical Exam Updated Vital Signs BP 154/64   Pulse 72   Temp 97.9 F (36.6 C) (Oral)   Resp 15   Ht 5\' 6"  (1.676 m)   Wt 84.8 kg   LMP 04/26/2016   SpO2 100%   BMI 30.18 kg/m   Physical Exam  Constitutional: She appears well-developed and well-nourished. No distress.  HENT:  Head: Normocephalic.  Neck: Neck supple.  Cardiovascular: Normal rate.   Pulmonary/Chest: Effort normal. She has no wheezes.  Musculoskeletal: Normal range of motion. She exhibits no edema.  Skin:  Pedunculated growth approx 0.5 cm diameter right lateral mid index finger.  Moist appearing,  pearly pink coloration with surrounding desquamation of keratin layer around the base of the growth. No surrounding erythema.  Distal sensation intact.     ED Treatments / Results  Labs (all labs ordered are listed, but only abnormal results are displayed) Labs Reviewed - No data to display  EKG  EKG Interpretation None       Radiology No results found.  Procedures Procedures (including critical care time)  Medications Ordered in ED Medications - No data to display   Initial Impression / Assessment and Plan / ED Course  I have reviewed the triage vital signs and the nursing notes.  Pertinent labs & imaging results that were available during my care of the patient were reviewed by me and considered in my medical decision making (see chart for  details).  Clinical Course     Advised can try aldara cream tiw, if not improved x 4 weeks, f/u with pcp who may consider arranging surgical excision.  Pt agrees with and understands plan.  Final Clinical Impressions(s) / ED Diagnoses   Final diagnoses:  Pyogenic granuloma    New Prescriptions Discharge Medication List as of 05/11/2016  8:52 PM    START taking these medications   Details  imiquimod (ALDARA) 5 % cream Apply topically 3 (three) times a week., Starting Wed 05/12/2016, Print         Burgess Amor, PA-C 05/11/16 2354    Marily Memos, MD 05/12/16 1216

## 2016-05-19 ENCOUNTER — Encounter: Payer: Self-pay | Admitting: Advanced Practice Midwife

## 2016-05-19 ENCOUNTER — Ambulatory Visit (INDEPENDENT_AMBULATORY_CARE_PROVIDER_SITE_OTHER): Payer: Medicaid Other | Admitting: Advanced Practice Midwife

## 2016-05-19 VITALS — BP 140/80 | HR 72 | Wt 190.0 lb

## 2016-05-19 DIAGNOSIS — Z30017 Encounter for initial prescription of implantable subdermal contraceptive: Secondary | ICD-10-CM

## 2016-05-19 DIAGNOSIS — Z3202 Encounter for pregnancy test, result negative: Secondary | ICD-10-CM | POA: Diagnosis not present

## 2016-05-19 DIAGNOSIS — Z3049 Encounter for surveillance of other contraceptives: Secondary | ICD-10-CM | POA: Diagnosis not present

## 2016-05-19 LAB — POCT URINE PREGNANCY: PREG TEST UR: NEGATIVE

## 2016-05-19 NOTE — Progress Notes (Signed)
  HPI:  Tammy Perry is a 36 y.o. year old African American female here for Nexplanon insertion.  She has not had sex for >2 weeks and her pregnancy test today was negative.  Risks/benefits/side effects of Nexplanon have been discussed and her questions have been answered.  Specifically, a failure rate of 04/998 has been reported, with an increased failure rate if pt takes St. John's Wort and/or antiseizure medicaitons.  Tammy Perry is aware of the common side effect of irregular bleeding, which the incidence of decreases over time.   Past Medical History: Past Medical History:  Diagnosis Date  . Medical history non-contributory   . Nausea 08/20/2015  . Pregnant 08/20/2015    Past Surgical History: Past Surgical History:  Procedure Laterality Date  . CESAREAN SECTION      Family History: Family History  Problem Relation Age of Onset  . Cancer Mother     lung  . Hypertension Mother   . Hyperlipidemia Mother   . Asthma Son   . Diabetes Maternal Uncle     Social History: Social History  Substance Use Topics  . Smoking status: Never Smoker  . Smokeless tobacco: Never Used  . Alcohol use No    Allergies: No Known Allergies    Her left arm, approximatly 4 inches proximal from the elbow, was cleansed with alcohol and anesthetized with 2cc of 2% Lidocaine.  The area was cleansed again and the Nexplanon was inserted without difficulty.  A pressure bandage was applied.  Pt was instructed to remove pressure bandage in a few hours, and keep insertion site covered with a bandaid for 3 days.  Back up contraception was recommended for 2 weeks.  Follow-up scheduled PRN problems  CRESENZO-DISHMAN,Tammy Perry 05/19/2016 3:22 PM

## 2016-06-04 ENCOUNTER — Encounter (HOSPITAL_COMMUNITY): Payer: Self-pay | Admitting: Emergency Medicine

## 2016-06-04 ENCOUNTER — Emergency Department (HOSPITAL_COMMUNITY)
Admission: EM | Admit: 2016-06-04 | Discharge: 2016-06-04 | Disposition: A | Discharge status: Home / Self Care | Payer: Medicaid Other | Attending: Dermatology | Admitting: Dermatology

## 2016-06-04 DIAGNOSIS — Z5321 Procedure and treatment not carried out due to patient leaving prior to being seen by health care provider: Secondary | ICD-10-CM | POA: Insufficient documentation

## 2016-06-04 DIAGNOSIS — B079 Viral wart, unspecified: Secondary | ICD-10-CM | POA: Insufficient documentation

## 2016-06-04 NOTE — ED Triage Notes (Signed)
Seen here in January dx'd per pt with a wart, given meds and now no better, no worse. She reports that she is on medicaid and has no PCP and cannot get one.

## 2016-06-04 NOTE — ED Notes (Signed)
Pt not wanting to wait in waiting area due to peak FLu, ask pt is she followed up as direction. Spoke with Tammy Perry about last visit. Pt not aware she need to go anywhere else, Review last discharge papers with pt, she wants to leave and make appointment Monday with public health.  Pt is aware she is welcome to wait to be rechecked, pt wants to leave

## 2016-08-25 ENCOUNTER — Ambulatory Visit: Payer: Medicaid Other | Admitting: Family Medicine

## 2016-09-22 ENCOUNTER — Ambulatory Visit: Payer: Medicaid Other | Admitting: Family Medicine

## 2016-11-09 ENCOUNTER — Ambulatory Visit: Payer: Medicaid Other | Admitting: Family Medicine

## 2017-01-11 ENCOUNTER — Ambulatory Visit: Payer: Medicaid Other | Admitting: Family Medicine

## 2017-02-23 ENCOUNTER — Ambulatory Visit: Payer: Medicaid Other | Admitting: Family Medicine

## 2017-03-15 ENCOUNTER — Ambulatory Visit: Payer: Medicaid Other | Admitting: Family Medicine

## 2017-04-05 ENCOUNTER — Ambulatory Visit: Payer: Medicaid Other | Admitting: Family Medicine

## 2017-04-29 ENCOUNTER — Ambulatory Visit: Payer: Medicaid Other | Admitting: Family Medicine

## 2017-07-27 ENCOUNTER — Other Ambulatory Visit: Payer: Self-pay

## 2017-07-27 ENCOUNTER — Emergency Department (HOSPITAL_COMMUNITY)
Admission: EM | Admit: 2017-07-27 | Discharge: 2017-07-27 | Disposition: A | Discharge status: Home / Self Care | Payer: Medicaid Other | Attending: Emergency Medicine | Admitting: Emergency Medicine

## 2017-07-27 ENCOUNTER — Encounter (HOSPITAL_COMMUNITY): Payer: Self-pay | Admitting: Emergency Medicine

## 2017-07-27 DIAGNOSIS — R070 Pain in throat: Secondary | ICD-10-CM | POA: Diagnosis present

## 2017-07-27 DIAGNOSIS — J069 Acute upper respiratory infection, unspecified: Secondary | ICD-10-CM | POA: Diagnosis not present

## 2017-07-27 DIAGNOSIS — B9789 Other viral agents as the cause of diseases classified elsewhere: Secondary | ICD-10-CM | POA: Diagnosis not present

## 2017-07-27 LAB — RAPID STREP SCREEN (MED CTR MEBANE ONLY): Streptococcus, Group A Screen (Direct): NEGATIVE

## 2017-07-27 MED ORDER — FLUTICASONE PROPIONATE 50 MCG/ACT NA SUSP: 1.0000 | Freq: Every day | NASAL | 0 refills | Status: DC

## 2017-07-27 MED ORDER — PROMETHAZINE-DM 6.25-15 MG/5ML PO SYRP: 5.0000 mL | ORAL_SOLUTION | Freq: Four times a day (QID) | ORAL | 0 refills | Status: DC | PRN

## 2017-07-27 MED ORDER — BENZONATATE 100 MG PO CAPS: 100.0000 mg | ORAL_CAPSULE | Freq: Three times a day (TID) | ORAL | 0 refills | Status: DC

## 2017-07-27 NOTE — Discharge Instructions (Addendum)
You likely have a viral illness.  This should be treated symptomatically. Use Tylenol or ibuprofen as needed for fevers or body aches. Use Flonase daily for nasal congestion and cough. Use cough perles and syrup as needed.  Make sure you stay well-hydrated with water. Wash your hands frequently to prevent spread of infection. Return to the emergency room if you develop chest pain, difficulty breathing, or any new or worsening symptoms.

## 2017-07-27 NOTE — ED Provider Notes (Signed)
[facility] Provider Note   CSN: [id number] Arrival date & time: 07/27/17  1151     History   Chief Complaint Chief Complaint  Patient presents with  . Sore Throat    HPI Tammy Perry is a 37 y.o. female presenting for evaluation of sore throat and nasal congestion.  Patient states that her symptoms began on Monday, 3 days ago.  She has had persistent sore throat, worse with swallowing.  She is associated nasal congestion.  She has a mild cough, worse at night.  Is nonproductive.  She denies fevers, chills, ear pain, eye pain, sinus pressure, chest pain, shortness breath, nausea, vomiting, abdominal pain, urinary symptoms, abnormal bowel movements.  She denies sick contacts.  She has no history of asthma or COPD.  She does not smoke cigarettes.  HPI  Past Medical History:  Diagnosis Date  . Medical history non-contributory   . Nausea 08/20/2015  . Pregnant 08/20/2015    Patient Active Problem List   Diagnosis Date Noted  . Nexplanon insertion 05/19/2016  . Preeclampsia 03/16/2016  . Previous cesarean delivery affecting pregnancy, antepartum 09/10/2015  . Hx successful VBAC (vaginal birth after cesarean), currently pregnant 09/10/2015  . Nausea 08/20/2015    Past Surgical History:  Procedure Laterality Date  . CESAREAN SECTION       OB History    Gravida  3   Para  3   Term  3   Preterm      AB      Living  3     SAB      TAB      Ectopic      Multiple  0   Live Births  3            Home Medications    Prior to Admission medications   Medication Sig Start Date End Date Taking? Authorizing Provider  benzonatate (TESSALON) 100 MG capsule Take 1 capsule (100 mg total) by mouth every 8 (eight) hours. 07/27/17   Kylor Valverde, PA-C  fluticasone (FLONASE) 50 MCG/ACT nasal spray Place 1 spray into both nostrils daily. 07/27/17   Sadee Osland, PA-C  promethazine-dextromethorphan (PROMETHAZINE-DM) 6.25-15 MG/5ML syrup  Take 5 mLs by mouth 4 (four) times daily as needed for cough. 07/27/17   Xitlali Kastens, PA-C    Family History Family History  Problem Relation Age of Onset  . Cancer Mother        lung  . Hypertension Mother   . Hyperlipidemia Mother   . Asthma Son   . Diabetes Maternal Uncle     Social History Social History   Tobacco Use  . Smoking status: Never Smoker  . Smokeless tobacco: Never Used  Substance Use Topics  . Alcohol use: No  . Drug use: No     Allergies   Patient has no known allergies.   Review of Systems Review of Systems  Constitutional: Negative for chills and fever.  HENT: Positive for congestion and sore throat.   Respiratory: Positive for cough. Negative for chest tightness and shortness of breath.   Cardiovascular: Negative for chest pain.     Physical Exam Updated Vital Signs BP 130/69   Pulse (!) 102   Temp 99 F (37.2 C) (Oral)   Resp 17   Ht 5\' 6"  (1.676 m)   Wt 83.9 kg (185 lb)   SpO2 99%   BMI 29.86 kg/m   Physical Exam  Constitutional: She is oriented to person, place, and  time. She appears well-developed and well-nourished. No distress.  HENT:  Head: Normocephalic and atraumatic.  Right Ear: Tympanic membrane, external ear and ear canal normal.  Left Ear: Tympanic membrane, external ear and ear canal normal.  Nose: Mucosal edema present. Right sinus exhibits no maxillary sinus tenderness and no frontal sinus tenderness. Left sinus exhibits no maxillary sinus tenderness and no frontal sinus tenderness.  Mouth/Throat: Uvula is midline, oropharynx is clear and moist and mucous membranes are normal. No tonsillar exudate.  Nasal mucosal edema.  OP clear with mild bilateral tonsillar swelling. No exudate.  Uvula midline with equal palate rise.  Handling secretions easily.  No trismus.  No hot potato voice.  TMs nonerythematous and nonbulging bilaterally.  No tenderness to palpation of the sinuses.  Eyes: Pupils are equal, round, and  reactive to light. Conjunctivae and EOM are normal.  Neck: Normal range of motion.  Cardiovascular: Normal rate, regular rhythm and intact distal pulses.  Pulmonary/Chest: Effort normal and breath sounds normal. She has no decreased breath sounds. She has no wheezes. She has no rhonchi. She has no rales.  Pt speaking in full sentences without difficulty. Clear lung sounds in all fields  Abdominal: Soft. She exhibits no distension. There is no tenderness.  Musculoskeletal: Normal range of motion.  Lymphadenopathy:    She has no cervical adenopathy.  Neurological: She is alert and oriented to person, place, and time.  Skin: Skin is warm.  Psychiatric: She has a normal mood and affect.  Nursing note and vitals reviewed.    ED Treatments / Results  Labs (all labs ordered are listed, but only abnormal results are displayed) Labs Reviewed  RAPID STREP SCREEN (NOT AT [facility])  CULTURE, GROUP A STREP [facility])    EKG None  Radiology No results found.  Procedures Procedures (including critical care time)  Medications Ordered in ED Medications - No data to display   Initial Impression / Assessment and Plan / ED Course  I have reviewed the triage vital signs and the nursing notes.  Pertinent labs & imaging results that were available during my care of the patient were reviewed by me and considered in my medical decision making (see chart for details).     Patient presenting with 3 day h/o URI symptoms.  Physical exam reassuring, patient is afebrile and appears nontoxic.  Pulmonary exam reassuring.  Doubt pneumonia, strep, other bacterial infection, or peritonsillar abscess. Rapid strep negative.  Likely viral URI.  Will treat symptomatically.  Patient to follow-up as needed.  At this time, patient appears safe for discharge.  Return precautions given.  Patient states she understands and agrees to plan.   Final Clinical Impressions(s) / ED Diagnoses   Final diagnoses:  Viral URI with  cough    ED Discharge Orders        Ordered    fluticasone (FLONASE) 50 MCG/ACT nasal spray  Daily     07/27/17 1303    benzonatate (TESSALON) 100 MG capsule  Every 8 hours     07/27/17 1303    promethazine-dextromethorphan (PROMETHAZINE-DM) 6.25-15 MG/5ML syrup  4 times daily PRN     07/27/17 1303       Dwaine Pringle, PA-C 07/27/17 1332    Loren Racer, MD 07/27/17 [phone]

## 2017-07-27 NOTE — ED Triage Notes (Signed)
Pt c/o of sore throat with drainage since Monday. No fever.

## 2017-07-29 LAB — CULTURE, GROUP A STREP (THRC)

## 2018-02-28 ENCOUNTER — Telehealth: Payer: Self-pay | Admitting: Advanced Practice Midwife

## 2018-02-28 NOTE — Telephone Encounter (Signed)
Pt called stating that she does not have regular periods since having her nexplanon inserted Jan 2018. She states that she has has period like bleeding for almost 4 weeks now. Advised pt that she should make an appt for evaluation by a provider. Pt verbalized understanding.

## 2018-02-28 NOTE — Telephone Encounter (Signed)
Patient called, stated that she has the implanon and she has been bleeding going on four weeks now.  She wants to know if these are related or if she needs an appointment to come in.  [facility] on [address] [phone]

## 2018-03-09 ENCOUNTER — Ambulatory Visit: Payer: Self-pay | Admitting: Advanced Practice Midwife

## 2018-03-15 ENCOUNTER — Ambulatory Visit: Payer: Self-pay | Admitting: Advanced Practice Midwife

## 2018-03-22 ENCOUNTER — Encounter: Payer: Self-pay | Admitting: Advanced Practice Midwife

## 2018-03-22 ENCOUNTER — Ambulatory Visit: Payer: Medicaid Other | Admitting: Advanced Practice Midwife

## 2018-03-22 VITALS — BP 126/76 | HR 99 | Ht 66.0 in | Wt 219.0 lb

## 2018-03-22 DIAGNOSIS — Z975 Presence of (intrauterine) contraceptive device: Secondary | ICD-10-CM

## 2018-03-22 DIAGNOSIS — N921 Excessive and frequent menstruation with irregular cycle: Secondary | ICD-10-CM | POA: Diagnosis not present

## 2018-03-22 MED ORDER — MEGESTROL ACETATE 40 MG PO TABS: ORAL_TABLET | ORAL | 3 refills | Status: DC

## 2018-03-22 NOTE — Progress Notes (Signed)
````````   [facility] Visit  Patient name: Tammy Perry MRN [id number]  Date of birth: 08/06/1980  CC & HPI:  Tammy Perry is a 37 y.o. African American female presenting today for irregular bleeding. Had Nexplanon placed almost 2 years ago.  She bleeds off and on, usually only once a m o nth or so.  She has now been having bleeding for a month. Heavy and light.  Has not tried megace yet.   Pertinent History Reviewed:  Medical & Surgical Hx:   Past Medical History:  Diagnosis Date  . Medical history non-contributory   . Nausea 08/20/2015  . Pregnant 08/20/2015   Past Surgical History:  Procedure Laterality Date  . CESAREAN SECTION     Family History  Problem Relation Age of Onset  . Cancer Mother        lung  . Hypertension Mother   . Hyperlipidemia Mother   . Asthma Son   . Diabetes Maternal Uncle     Current Outpatient Medications:  .  benzonatate (TESSALON) 100 MG capsule, Take 1 capsule (100 mg total) by mouth every 8 (eight) hours. (Patient not taking: Reported on 03/22/2018), Disp: 21 capsule, Rfl: 0 .  fluticasone (FLONASE) 50 MCG/ACT nasal spray, Place 1 spray into both nostrils daily. (Patient not taking: Reported on 03/22/2018), Disp: 16 g, Rfl: 0 .  megestrol (MEGACE) 40 MG tablet, Take 3/day (at the same time) for 5 days; 2/day for 5 days, then 1/day PO prn bleeding, Disp: 60 tablet, Rfl: 3 .  promethazine-dextromethorphan (PROMETHAZINE-DM) 6.25-15 MG/5ML syrup, Take 5 mLs by mouth 4 (four) times daily as needed for cough. (Patient not taking: Reported on 03/22/2018), Disp: 118 mL, Rfl: 0 Social History: Reviewed -  reports that she has never smoked. She has never used smokeless tobacco.  Review of Systems:   Constitutional: Negative for fever and chills Eyes: Negative for visual disturbances Respiratory: Negative for shortness of breath, dyspnea Cardiovascular: Negative for chest pain or palpitations  Gastrointestinal: Negative for vomiting,  diarrhea and constipation; no abdominal pain Genitourinary: Negative for dysuria and urgency, vaginal irritation or itching Musculoskeletal: Negative for back pain, joint pain, myalgias  Neurological: Negative for dizziness and headaches    Objective Findings:    Physical Examination: Vitals:   03/22/18 1611  BP: 126/76  Pulse: 99   General appearance - well appearing, and in no distress Mental status - alert, oriented to person, place, and time Chest:  Normal respiratory effort Heart - normal rate and regular rhythm Abdomen:  Soft, nontender Pelvic: SSE:  Mod amount of dark menstrual blood.  No evidence of infection and dx not friable Musculoskeletal:  Normal range of motion without pain Extremities:  No edema    No results found for this or any previous visit (from the past 24 hour(s)).    Assessment & Plan:  A:   BTB on Nexplanon P:  Megace algorhithm   Return for If you have any problems.  Jacklyn ShellFrances Cresenzo-Dishmon CNM 03/23/2018 1:44 PM

## 2019-02-13 ENCOUNTER — Other Ambulatory Visit: Payer: Self-pay

## 2019-02-13 DIAGNOSIS — Z20822 Contact with and (suspected) exposure to covid-19: Secondary | ICD-10-CM

## 2019-02-14 LAB — NOVEL CORONAVIRUS, NAA: SARS-CoV-2, NAA: NOT DETECTED

## 2019-03-01 ENCOUNTER — Other Ambulatory Visit: Payer: Self-pay

## 2019-03-01 DIAGNOSIS — Z20822 Contact with and (suspected) exposure to covid-19: Secondary | ICD-10-CM

## 2019-03-03 LAB — NOVEL CORONAVIRUS, NAA: SARS-CoV-2, NAA: NOT DETECTED

## 2019-07-13 ENCOUNTER — Other Ambulatory Visit: Payer: Self-pay

## 2019-07-13 ENCOUNTER — Ambulatory Visit (INDEPENDENT_AMBULATORY_CARE_PROVIDER_SITE_OTHER): Payer: Medicaid Other | Admitting: Women's Health

## 2019-07-13 ENCOUNTER — Encounter: Payer: Self-pay | Admitting: Women's Health

## 2019-07-13 VITALS — BP 142/87 | HR 86 | Ht 66.0 in | Wt 227.0 lb

## 2019-07-13 DIAGNOSIS — Z3046 Encounter for surveillance of implantable subdermal contraceptive: Secondary | ICD-10-CM

## 2019-07-13 DIAGNOSIS — R030 Elevated blood-pressure reading, without diagnosis of hypertension: Secondary | ICD-10-CM | POA: Diagnosis not present

## 2019-07-13 DIAGNOSIS — Z30017 Encounter for initial prescription of implantable subdermal contraceptive: Secondary | ICD-10-CM

## 2019-07-13 DIAGNOSIS — Z3202 Encounter for pregnancy test, result negative: Secondary | ICD-10-CM

## 2019-07-13 LAB — POCT URINE PREGNANCY: Preg Test, Ur: NEGATIVE

## 2019-07-13 MED ORDER — ETONOGESTREL 68 MG ~~LOC~~ IMPL
68.0000 mg | DRUG_IMPLANT | Freq: Once | SUBCUTANEOUS | Status: AC
  Administered 2019-07-13: 68 mg via SUBCUTANEOUS

## 2019-07-13 NOTE — Progress Notes (Signed)
   NEXPLANON REMOVAL AND RE-INSERTION Patient name: Tammy Perry MRN [id number]  Date of birth: 01-21-1981 Subjective Findings:   Tammy Perry is a 39 y.o. G2P3003 African American female being seen today for Nexplanon removal and re-insertion. Her Nexplanon was placed 05/19/16.   No LMP recorded. Patient has had an implant. Last pap unsure. Results were:  normal  No h/o recent HTN she's aware of, had pre-e 2017. Family h/o HTN.   Risks/benefits/side effects of Nexplanon have been discussed and her questions have been answered.  Specifically, a failure rate of 04/998 has been reported, with an increased failure rate if pt takes St. John's Wort and/or antiseizure medicaitons.  She is aware of the common side effect of irregular bleeding, which the incidence of decreases over time. Signed copy of informed consent in chart.  Pertinent History Reviewed:   Reviewed past medical,surgical, social, obstetrical and family history.  Reviewed problem list, medications and allergies. Objective Findings & Procedure:    Vitals:   07/13/19 0851 07/13/19 0917  BP: (!) 144/92 (!) 142/87  Pulse: 88 86  Weight: 227 lb (103 kg)   Height: 5\' 6"  (1.676 m)   Body mass index is 36.64 kg/m.  Results for orders placed or performed in visit on 07/13/19 (from the past 24 hour(s))  POCT urine pregnancy   Collection Time: 07/13/19  8:53 AM  Result Value Ref Range   Preg Test, Ur Negative Negative     Time out was performed.  Nexplanon site identified.  Area prepped in usual sterile fashon. Two cc's of 2% lidocaine was used to anesthetize the area. A small stab incision was made right beside the implant on the distal portion.  The Nexplanon rod was grasped using hemostats and removed intact without difficulty.  The area was cleansed again with betadine and the Nexplanon was inserted approximately 10cm from the medial epicondyle and 3-5cm posterior to the sulcus per manufacturer's recommendations without  difficulty.  Steri-strips and a pressure bandage was applied.  There was less than 3 cc blood loss. There were no complications.  The patient tolerated the procedure well. Assessment & Plan:   1) Nexplanon removal & re-insertion She was instructed to keep the area clean and dry, remove pressure bandage in 24 hours, and keep insertion site covered with the steri-strips for 3-5 days.  She was given a card indicating date Nexplanon was inserted and date it needs to be removed.  Follow-up PRN problems.  2) Elevated bp> no h/o CHTN, did have pre-e in 2017, will recheck when she returns for pap & physical. Does not have a PCP  Orders Placed This Encounter  Procedures  . POCT urine pregnancy    Follow-up: Return for 1st available, Pap & physical.  2018 CNM, [facility] 07/13/2019 9:18 AM

## 2019-07-13 NOTE — Patient Instructions (Signed)
Keep the area clean and dry.  You can remove the big bandage in 24 hours, and the small steri-strip bandage in 3-5 days.  A back up method, such as condoms, should be used for two weeks. You may have irregular vaginal bleeding for the first 6 months after the Nexplanon is placed, then the bleeding usually lightens and it is possible that you may not have any periods.  If you have any concerns, please give us a call.    Etonogestrel implant What is this medicine? ETONOGESTREL (et oh noe JES trel) is a contraceptive (birth control) device. It is used to prevent pregnancy. It can be used for up to 3 years. This medicine may be used for other purposes; ask your health care provider or pharmacist if you have questions. COMMON BRAND NAME(S): Implanon, Nexplanon What should I tell my health care provider before I take this medicine? They need to know if you have any of these conditions:  abnormal vaginal bleeding  blood vessel disease or blood clots  breast, cervical, endometrial, ovarian, liver, or uterine cancer  diabetes  gallbladder disease  heart disease or recent heart attack  high blood pressure  high cholesterol or triglycerides  kidney disease  liver disease  migraine headaches  seizures  stroke  tobacco smoker  an unusual or allergic reaction to etonogestrel, anesthetics or antiseptics, other medicines, foods, dyes, or preservatives  pregnant or trying to get pregnant  breast-feeding How should I use this medicine? This device is inserted just under the skin on the inner side of your upper arm by a health care professional. Talk to your pediatrician regarding the use of this medicine in children. Special care may be needed. Overdosage: If you think you have taken too much of this medicine contact a poison control center or emergency room at once. NOTE: This medicine is only for you. Do not share this medicine with others. What if I miss a dose? This does not  apply. What may interact with this medicine? Do not take this medicine with any of the following medications:  amprenavir  fosamprenavir This medicine may also interact with the following medications:  acitretin  aprepitant  armodafinil  bexarotene  bosentan  carbamazepine  certain medicines for fungal infections like fluconazole, ketoconazole, itraconazole and voriconazole  certain medicines to treat hepatitis, HIV or AIDS  cyclosporine  felbamate  griseofulvin  lamotrigine  modafinil  oxcarbazepine  phenobarbital  phenytoin  primidone  rifabutin  rifampin  rifapentine  St. John's wort  topiramate This list may not describe all possible interactions. Give your health care provider a list of all the medicines, herbs, non-prescription drugs, or dietary supplements you use. Also tell them if you smoke, drink alcohol, or use illegal drugs. Some items may interact with your medicine. What should I watch for while using this medicine? This product does not protect you against HIV infection (AIDS) or other sexually transmitted diseases. You should be able to feel the implant by pressing your fingertips over the skin where it was inserted. Contact your doctor if you cannot feel the implant, and use a non-hormonal birth control method (such as condoms) until your doctor confirms that the implant is in place. Contact your doctor if you think that the implant may have broken or become bent while in your arm. You will receive a user card from your health care provider after the implant is inserted. The card is a record of the location of the implant in your upper arm   and when it should be removed. Keep this card with your health records. What side effects may I notice from receiving this medicine? Side effects that you should report to your doctor or health care professional as soon as possible:  allergic reactions like skin rash, itching or hives, swelling of the  face, lips, or tongue  breast lumps, breast tissue changes, or discharge  breathing problems  changes in emotions or moods  coughing up blood  if you feel that the implant may have broken or bent while in your arm  high blood pressure  pain, irritation, swelling, or bruising at the insertion site  scar at site of insertion  signs of infection at the insertion site such as fever, and skin redness, pain or discharge  signs and symptoms of a blood clot such as breathing problems; changes in vision; chest pain; severe, sudden headache; pain, swelling, warmth in the leg; trouble speaking; sudden numbness or weakness of the face, arm or leg  signs and symptoms of liver injury like dark yellow or brown urine; general ill feeling or flu-like symptoms; light-colored stools; loss of appetite; nausea; right upper belly pain; unusually weak or tired; yellowing of the eyes or skin  unusual vaginal bleeding, discharge Side effects that usually do not require medical attention (report to your doctor or health care professional if they continue or are bothersome):  acne  breast pain or tenderness  headache  irregular menstrual bleeding  nausea This list may not describe all possible side effects. Call your doctor for medical advice about side effects. You may report side effects to FDA at 1-800-FDA-1088. Where should I keep my medicine? This drug is given in a hospital or clinic and will not be stored at home. NOTE: This sheet is a summary. It may not cover all possible information. If you have questions about this medicine, talk to your doctor, pharmacist, or health care provider.  2020 Elsevier/Gold Standard (2019-01-23 11:33:04)  

## 2019-08-06 ENCOUNTER — Other Ambulatory Visit: Payer: Self-pay

## 2019-08-06 ENCOUNTER — Other Ambulatory Visit (HOSPITAL_COMMUNITY)
Admission: RE | Admit: 2019-08-06 | Discharge: 2019-08-06 | Disposition: A | Discharge status: Home / Self Care | Payer: Medicaid Other | Source: Ambulatory Visit | Attending: Obstetrics & Gynecology | Admitting: Obstetrics & Gynecology

## 2019-08-06 ENCOUNTER — Encounter: Payer: Self-pay | Admitting: Women's Health

## 2019-08-06 ENCOUNTER — Ambulatory Visit (INDEPENDENT_AMBULATORY_CARE_PROVIDER_SITE_OTHER): Payer: Medicaid Other | Admitting: Women's Health

## 2019-08-06 VITALS — BP 129/88 | HR 89 | Ht 66.75 in | Wt 226.6 lb

## 2019-08-06 DIAGNOSIS — Z0000 Encounter for general adult medical examination without abnormal findings: Secondary | ICD-10-CM | POA: Diagnosis not present

## 2019-08-06 DIAGNOSIS — Z113 Encounter for screening for infections with a predominantly sexual mode of transmission: Secondary | ICD-10-CM

## 2019-08-06 DIAGNOSIS — Z01419 Encounter for gynecological examination (general) (routine) without abnormal findings: Secondary | ICD-10-CM | POA: Diagnosis not present

## 2019-08-06 DIAGNOSIS — Z1151 Encounter for screening for human papillomavirus (HPV): Secondary | ICD-10-CM

## 2019-08-06 DIAGNOSIS — Z975 Presence of (intrauterine) contraceptive device: Secondary | ICD-10-CM | POA: Diagnosis not present

## 2019-08-06 DIAGNOSIS — R030 Elevated blood-pressure reading, without diagnosis of hypertension: Secondary | ICD-10-CM | POA: Diagnosis not present

## 2019-08-06 NOTE — Progress Notes (Signed)
WELL-WOMAN EXAMINATION Patient name: Tammy Perry MRN [id number]  Date of birth: 1980-08-08 Chief Complaint:   Gynecologic Exam  History of Present Illness:   Tammy Perry is a 39 y.o. G7P3003 African American female being seen today for a routine well-woman exam.  Current complaints: occ vag odor, no other sx, not currently happening, not related to sex/periods  Depression screen [facility] 2/9 08/06/2019  Decreased Interest 0  Down, Depressed, Hopeless 1  PHQ - 2 Score 1  Altered sleeping 0  Tired, decreased energy 1  Change in appetite 0  Feeling bad or failure about yourself  0  Trouble concentrating 0  Moving slowly or fidgety/restless 0  Suicidal thoughts 0  PHQ-9 Score 2  Difficult doing work/chores Not difficult at all     [facility]P: [facility]      does desire labs including STD screen No LMP recorded. Patient has had an implant. The current method of family planning is Nexplanon. Inserted 07/13/19 Last pap >36yrs ago at [facility]. Results were: normal. H/O abnormal pap: No Last mammogram: never. Results were: n/a. Family h/o breast cancer: No Last colonoscopy: never. Results were: n/a. Family h/o colorectal cancer: No Review of Systems:   Pertinent items are noted in HPI Denies any headaches, blurred vision, fatigue, shortness of breath, chest pain, abdominal pain, abnormal vaginal discharge/itching/odor/irritation, problems with periods, bowel movements, urination, or intercourse unless otherwise stated above. Pertinent History Reviewed:  Reviewed past medical,surgical, social and family history.  Reviewed problem list, medications and allergies. Physical Assessment:   Vitals:   08/06/19 1014 08/06/19 1019  BP: (!) 147/90 129/88  Pulse: (!) 111 89  Weight: 226 lb 9.6 oz (102.8 kg)   Height: 5' 6.75" (1.695 m)   Body mass index is 35.76 kg/m.        Physical Examination:   General appearance - well appearing, and in no distress  Mental status - alert, oriented to  person, place, and time  Psych:  She has a normal mood and affect  Skin - warm and dry, normal color, no suspicious lesions noted  Chest - effort normal, all lung fields clear to auscultation bilaterally  Heart - normal rate and regular rhythm  Neck:  midline trachea, no thyromegaly or nodules  Breasts - breasts appear normal, no suspicious masses, no skin or nipple changes or  axillary nodes  Abdomen - soft, nontender, nondistended, no masses or organomegaly  Pelvic - VULVA: normal appearing vulva with no masses, tenderness or lesions  VAGINA: normal appearing vagina with normal color and discharge, no lesions  CERVIX: normal appearing cervix without discharge or lesions, no CMT  Thin prep pap is done w/ HR HPV cotesting  UTERUS: uterus is felt to be normal size, shape, consistency and nontender   ADNEXA: No adnexal masses or tenderness noted.  Extremities:  No swelling or varicosities noted  Chaperone: Malachy Mood    No results found for this or any previous visit (from the past 24 hour(s)).  Assessment & Plan:  1) Well-Woman Exam  2) Occ vag odor> can make appt when happening again if she would like [place] to check it out  3) STD screen  Labs/procedures today: pap, labs as below  Mammogram @40yo  or sooner if problems Colonoscopy @39yo  or sooner if problems  Orders Placed This Encounter  Procedures  . CBC  . Comprehensive metabolic panel  . TSH  . HIV Antibody (routine testing w rflx)  . RPR    Meds: No orders of  the defined types were placed in this encounter.   Follow-up: Return in about 1 year (around 08/05/2020) for Physical.  [place], [facility] 08/06/2019 10:47 AM

## 2019-08-07 LAB — COMPREHENSIVE METABOLIC PANEL
ALT: 11 IU/L (ref 0–32)
AST: 14 IU/L (ref 0–40)
Albumin/Globulin Ratio: 1.4 (ref 1.2–2.2)
Albumin: 4.3 g/dL (ref 3.8–4.8)
Alkaline Phosphatase: 122 IU/L — ABNORMAL HIGH (ref 39–117)
BUN/Creatinine Ratio: 14 (ref 9–23)
BUN: 13 mg/dL (ref 6–20)
Bilirubin Total: 0.3 mg/dL (ref 0.0–1.2)
CO2: 21 mmol/L (ref 20–29)
Calcium: 9.3 mg/dL (ref 8.7–10.2)
Chloride: 104 mmol/L (ref 96–106)
Creatinine, Ser: 0.95 mg/dL (ref 0.57–1.00)
GFR calc Af Amer: 88 mL/min/{1.73_m2} (ref >59)
GFR calc non Af Amer: 76 mL/min/{1.73_m2} (ref >59)
Globulin, Total: 3 g/dL (ref 1.5–4.5)
Glucose: 88 mg/dL (ref 65–99)
Potassium: 4.6 mmol/L (ref 3.5–5.2)
Sodium: 139 mmol/L (ref 134–144)
Total Protein: 7.3 g/dL (ref 6.0–8.5)

## 2019-08-07 LAB — CBC
Hematocrit: 43 % (ref 34.0–46.6)
Hemoglobin: 13 g/dL (ref 11.1–15.9)
MCH: 21.3 pg — ABNORMAL LOW (ref 26.6–33.0)
MCHC: 30.2 g/dL — ABNORMAL LOW (ref 31.5–35.7)
MCV: 71 fL — ABNORMAL LOW (ref 79–97)
Platelets: 275 10*3/uL (ref 150–450)
RBC: 6.1 x10E6/uL — ABNORMAL HIGH (ref 3.77–5.28)
RDW: 15.9 % — ABNORMAL HIGH (ref 11.7–15.4)
WBC: 6.4 10*3/uL (ref 3.4–10.8)

## 2019-08-07 LAB — TSH: TSH: 2.2 u[IU]/mL (ref 0.450–4.500)

## 2019-08-07 LAB — HIV ANTIBODY (ROUTINE TESTING W REFLEX): HIV Screen 4th Generation wRfx: NONREACTIVE

## 2019-08-07 LAB — RPR: RPR Ser Ql: NONREACTIVE

## 2019-08-08 LAB — CYTOLOGY - PAP
Comment: NEGATIVE
Diagnosis: NEGATIVE
High risk HPV: NEGATIVE

## 2020-03-25 ENCOUNTER — Ambulatory Visit: Payer: Medicaid Other | Admitting: Internal Medicine

## 2020-06-03 DIAGNOSIS — B354 Tinea corporis: Secondary | ICD-10-CM | POA: Diagnosis not present

## 2020-06-17 ENCOUNTER — Telehealth: Payer: Self-pay | Admitting: Advanced Practice Midwife

## 2020-06-17 MED ORDER — MEGESTROL ACETATE 40 MG PO TABS: ORAL_TABLET | ORAL | 3 refills | Status: DC

## 2020-06-17 NOTE — Telephone Encounter (Signed)
Will refill megace 

## 2020-06-17 NOTE — Telephone Encounter (Signed)
Patient called stating that she has the nexplanon and she is having some bleeding while on this birth control. Patient states that Drenda Freeze has prescribed her a medication that helps with her bleeding while on Birth control and she would like to know if Drenda Freeze could call her in a refill of this medication. Please contact pt

## 2020-06-17 NOTE — Addendum Note (Signed)
Addended by: Cyril Mourning A on: 06/17/2020 04:28 PM   Modules accepted: Orders

## 2020-09-10 ENCOUNTER — Encounter: Payer: Self-pay | Admitting: Internal Medicine

## 2020-09-10 ENCOUNTER — Other Ambulatory Visit: Payer: Self-pay

## 2020-09-10 ENCOUNTER — Ambulatory Visit: Payer: Medicaid Other | Admitting: Internal Medicine

## 2020-09-10 VITALS — BP 138/88 | HR 96 | Temp 99.5°F | Resp 16 | Ht 66.0 in | Wt 236.4 lb

## 2020-09-10 DIAGNOSIS — L280 Lichen simplex chronicus: Secondary | ICD-10-CM

## 2020-09-10 DIAGNOSIS — Z7689 Persons encountering health services in other specified circumstances: Secondary | ICD-10-CM | POA: Diagnosis not present

## 2020-09-10 DIAGNOSIS — B354 Tinea corporis: Secondary | ICD-10-CM | POA: Diagnosis not present

## 2020-09-10 DIAGNOSIS — E669 Obesity, unspecified: Secondary | ICD-10-CM | POA: Insufficient documentation

## 2020-09-10 DIAGNOSIS — Z1159 Encounter for screening for other viral diseases: Secondary | ICD-10-CM

## 2020-09-10 MED ORDER — TRIAMCINOLONE ACETONIDE 0.1 % EX CREA: 1.0000 "application " | TOPICAL_CREAM | Freq: Two times a day (BID) | CUTANEOUS | 0 refills | Status: DC

## 2020-09-10 MED ORDER — ITRACONAZOLE 200 MG PO TABS: 1.0000 | ORAL_TABLET | Freq: Every day | ORAL | 0 refills | Status: DC

## 2020-09-10 NOTE — Assessment & Plan Note (Signed)
Diet modification and moderate exercise/walking at least 150 mins/week Discussed about possible medical therapies for obesity after optimal diet modification

## 2020-09-10 NOTE — Progress Notes (Signed)
New Patient Office Visit  Subjective:  Patient ID: Tammy Perry, female    DOB: 09/20/1980  Age: 40 y.o. MRN: [id number]  CC:  Chief Complaint  Patient presents with  . New Patient (Initial Visit)    New patient was being seen at health dept went to urgent care on [address] back in march she is having dark spots on chest left side of neck was given meds and told if it didn't go away she needed a dermatology referral has had places on foot in the past that hasnt cleared up either     HPI Tammy Perry is a 40 year old female with PMH of preeclampsia and obesity who presents for establishing care.  She has been having rash over her neck region, around right breast area and LLQ abdominal wall area. They are circular, lighter in color and are not itching or painful. She was prescribed Lamisil PO from Urgent care, which did not help with the rash. She also reports darkening of the skin over b/l feet, which is itching. She was given a ?steroid cream, which improved it in the past, but does not recall the name.  She has had 2 doses of COVID vaccine.  Past Medical History:  Diagnosis Date  . Preeclampsia 03/16/2016    Past Surgical History:  Procedure Laterality Date  . CESAREAN SECTION      Family History  Problem Relation Age of Onset  . Cancer Mother        lung  . Hypertension Mother   . Hyperlipidemia Mother   . Asthma Son   . Diabetes Maternal Uncle     Social History   Socioeconomic History  . Marital status: Single    Spouse name: Not on file  . Number of children: Not on file  . Years of education: Not on file  . Highest education level: Not on file  Occupational History  . Not on file  Tobacco Use  . Smoking status: Never Smoker  . Smokeless tobacco: Never Used  Vaping Use  . Vaping Use: Never used  Substance and Sexual Activity  . Alcohol use: No  . Drug use: No  . Sexual activity: Yes    Birth control/protection: Implant  Other Topics  Concern  . Not on file  Social History Narrative  . Not on file   Social Determinants of Health   Financial Resource Strain: Not on file  Food Insecurity: Not on file  Transportation Needs: Not on file  Physical Activity: Not on file  Stress: Not on file  Social Connections: Not on file  Intimate Partner Violence: Not on file    ROS Review of Systems  Constitutional: Negative for chills and fever.  HENT: Negative for congestion, sinus pressure, sinus pain and sore throat.   Eyes: Negative for pain and discharge.  Respiratory: Negative for cough and shortness of breath.   Cardiovascular: Negative for chest pain and palpitations.  Gastrointestinal: Negative for abdominal pain, constipation, diarrhea, nausea and vomiting.  Endocrine: Negative for polydipsia and polyuria.  Genitourinary: Negative for dysuria and hematuria.  Musculoskeletal: Negative for neck pain and neck stiffness.  Skin: Positive for rash.  Neurological: Negative for dizziness and weakness.  Psychiatric/Behavioral: Negative for agitation and behavioral problems.    Objective:   Today's Vitals: BP 138/88 (BP Location: Right Arm, Patient Position: Sitting, Cuff Size: Normal)   Pulse 96   Temp 99.5 F (37.5 C) (Oral)   Resp 16   Ht 5' 6"  (  1.676 m)   Wt 236 lb 6.4 oz (107.2 kg)   SpO2 97%   BMI 38.16 kg/m   Physical Exam Vitals reviewed.  Constitutional:      General: She is not in acute distress.    Appearance: She is obese. She is not diaphoretic.  HENT:     Head: Normocephalic and atraumatic.     Nose: Nose normal.     Mouth/Throat:     Mouth: Mucous membranes are moist.  Eyes:     General: No scleral icterus.    Extraocular Movements: Extraocular movements intact.  Cardiovascular:     Rate and Rhythm: Normal rate and regular rhythm.     Pulses: Normal pulses.     Heart sounds: Normal heart sounds. No murmur heard.   Pulmonary:     Breath sounds: Normal breath sounds. No wheezing or  rales.  Abdominal:     Palpations: Abdomen is soft.     Tenderness: There is no abdominal tenderness.  Musculoskeletal:     Cervical back: Neck supple. No tenderness.     Right lower leg: No edema.     Left lower leg: No edema.  Skin:    General: Skin is warm.     Findings: Rash (Circular, hypopigmented patches noted over neck region, right breast area and LLQ abdominal wall) present.  Neurological:     General: No focal deficit present.     Mental Status: She is alert and oriented to person, place, and time.  Psychiatric:        Mood and Affect: Mood normal.        Behavior: Behavior normal.     Assessment & Plan:   Problem List Items Addressed This Visit      Encounter to establish care - Primary   Care established Previous chart reviewed History and medications reviewed with the patient     Relevant Orders  CBC with Differential/Platelet  CMP14+EGFR  Hemoglobin A1c  Lipid panel  TSH    Musculoskeletal and Integument   Lichenified eczema    Appears to be chronic Kenalog cream prescribed      Relevant Medications   triamcinolone cream (KENALOG) 0.1 %   Tinea corporis    Over neck, around breast and LLQ abdominal area Tried Lamisil Started Itraconazole for 1 week If persistent, will refer to Dermatology      Relevant Medications   Itraconazole 200 MG TABS     Other      Obesity (BMI 30-39.9)    Diet modification and moderate exercise/walking at least 150 mins/week Discussed about possible medical therapies for obesity after optimal diet modification       Other Visit Diagnoses    Need for hepatitis C screening test       Relevant Orders   Hepatitis C Antibody      Outpatient Encounter Medications as of 09/10/2020  Medication Sig  . Itraconazole 200 MG TABS Take 1 tablet by mouth daily.  . megestrol (MEGACE) 40 MG tablet Take 3/day (at the same time) for 5 days; 2/day for 5 days, then 1/day PO prn bleeding  . triamcinolone cream (KENALOG) 0.1 %  Apply 1 application topically 2 (two) times daily.  . [DISCONTINUED] terbinafine (LAMISIL) 250 MG tablet Take 250 mg by mouth daily. (Patient not taking: Reported on 09/10/2020)   No facility-administered encounter medications on file as of 09/10/2020.    Follow-up: Return in about 6 months (around 03/13/2021) for Weight.   Lindell Spar, MD

## 2020-09-10 NOTE — Patient Instructions (Signed)
Please apply Triamcinolone cream over foot rash.  Please take Itraconazole as prescribed for neck and abdominal rash. If you have persistent rash, please contact us.  Please follow low carb and low sodium diet and perform moderate exercise/walking at least 150 mins/week.

## 2020-09-10 NOTE — Assessment & Plan Note (Signed)
Care established Previous chart reviewed History and medications reviewed with the patient 

## 2020-09-10 NOTE — Assessment & Plan Note (Signed)
Appears to be chronic Kenalog cream prescribed

## 2020-09-10 NOTE — Assessment & Plan Note (Signed)
Over neck, around breast and LLQ abdominal area Tried Lamisil Started Itraconazole for 1 week If persistent, will refer to Dermatology

## 2020-09-11 LAB — CBC WITH DIFFERENTIAL/PLATELET
Basophils Absolute: 0 10*3/uL (ref 0.0–0.2)
Basos: 0 %
EOS (ABSOLUTE): 0.1 10*3/uL (ref 0.0–0.4)
Eos: 1 %
Hematocrit: 42 % (ref 34.0–46.6)
Hemoglobin: 12.8 g/dL (ref 11.1–15.9)
Immature Grans (Abs): 0 10*3/uL (ref 0.0–0.1)
Immature Granulocytes: 0 %
Lymphocytes Absolute: 1.9 10*3/uL (ref 0.7–3.1)
Lymphs: 37 %
MCH: 21.7 pg — ABNORMAL LOW (ref 26.6–33.0)
MCHC: 30.5 g/dL — ABNORMAL LOW (ref 31.5–35.7)
MCV: 71 fL — ABNORMAL LOW (ref 79–97)
Monocytes Absolute: 0.4 10*3/uL (ref 0.1–0.9)
Monocytes: 7 %
Neutrophils Absolute: 2.8 10*3/uL (ref 1.4–7.0)
Neutrophils: 55 %
Platelets: 272 10*3/uL (ref 150–450)
RBC: 5.89 x10E6/uL — ABNORMAL HIGH (ref 3.77–5.28)
RDW: 14.9 % (ref 11.7–15.4)
WBC: 5.2 10*3/uL (ref 3.4–10.8)

## 2020-09-11 LAB — LIPID PANEL
Chol/HDL Ratio: 7.1 ratio — ABNORMAL HIGH (ref 0.0–4.4)
Cholesterol, Total: 213 mg/dL — ABNORMAL HIGH (ref 100–199)
HDL: 30 mg/dL — ABNORMAL LOW (ref >39)
LDL Chol Calc (NIH): 142 mg/dL — ABNORMAL HIGH (ref 0–99)
Triglycerides: 223 mg/dL — ABNORMAL HIGH (ref 0–149)
VLDL Cholesterol Cal: 41 mg/dL — ABNORMAL HIGH (ref 5–40)

## 2020-09-11 LAB — CMP14+EGFR
ALT: 10 IU/L (ref 0–32)
AST: 13 IU/L (ref 0–40)
Albumin/Globulin Ratio: 1.6 (ref 1.2–2.2)
Albumin: 4.4 g/dL (ref 3.8–4.8)
Alkaline Phosphatase: 117 IU/L (ref 44–121)
BUN/Creatinine Ratio: 12 (ref 9–23)
BUN: 11 mg/dL (ref 6–20)
Bilirubin Total: 0.3 mg/dL (ref 0.0–1.2)
CO2: 20 mmol/L (ref 20–29)
Calcium: 9.1 mg/dL (ref 8.7–10.2)
Chloride: 104 mmol/L (ref 96–106)
Creatinine, Ser: 0.92 mg/dL (ref 0.57–1.00)
Globulin, Total: 2.8 g/dL (ref 1.5–4.5)
Glucose: 100 mg/dL — ABNORMAL HIGH (ref 65–99)
Potassium: 4.4 mmol/L (ref 3.5–5.2)
Sodium: 140 mmol/L (ref 134–144)
Total Protein: 7.2 g/dL (ref 6.0–8.5)
eGFR: 81 mL/min/{1.73_m2} (ref >59)

## 2020-09-11 LAB — HEMOGLOBIN A1C
Est. average glucose Bld gHb Est-mCnc: 126 mg/dL
Hgb A1c MFr Bld: 6 % — ABNORMAL HIGH (ref 4.8–5.6)

## 2020-09-11 LAB — TSH: TSH: 1.46 u[IU]/mL (ref 0.450–4.500)

## 2020-09-11 LAB — HEPATITIS C ANTIBODY: Hep C Virus Ab: <0.1 s/co ratio (ref 0.0–0.9)

## 2020-09-15 ENCOUNTER — Telehealth: Payer: Self-pay

## 2020-09-15 NOTE — Telephone Encounter (Signed)
Patient called pharmacy and pharmacy told patient needed a prior authorization on this medicine Itraconazole 200 MG TABS  before getting this medicine.   Pharmacy: [facility] [place]

## 2020-09-23 NOTE — Telephone Encounter (Signed)
Have not received prior auth on medication if pt still needs will send over

## 2020-10-01 ENCOUNTER — Telehealth: Payer: Self-pay | Admitting: *Deleted

## 2020-10-01 DIAGNOSIS — B354 Tinea corporis: Secondary | ICD-10-CM

## 2020-10-01 NOTE — Telephone Encounter (Signed)
Pt is calling the itraconazole that was called in is on back order at pharmacy. Pt advised dr patel was out of the office this week this was sent in along with cream for rash and when dr patel arrived back on Monday we would give her a call and let her know what he recommended

## 2020-10-05 MED ORDER — FLUCONAZOLE 150 MG PO TABS: 150.0000 mg | ORAL_TABLET | ORAL | 0 refills | Status: AC

## 2020-10-05 NOTE — Addendum Note (Signed)
Addended byTrena Platt on: 10/05/2020 03:30 PM   Modules accepted: Orders

## 2020-10-06 NOTE — Telephone Encounter (Signed)
Pt advised with verbal understanding  °

## 2020-11-28 ENCOUNTER — Telehealth: Payer: Self-pay

## 2020-11-28 NOTE — Telephone Encounter (Signed)
Patient called she states when she was seen in April she was given a cream for a rash on her foot and it hasnt gotten any better she said they talked about a referral to dermatology does she need to be seen again or can we place the referral ph# [phone]

## 2020-12-01 ENCOUNTER — Encounter: Payer: Self-pay | Admitting: Internal Medicine

## 2020-12-01 NOTE — Telephone Encounter (Signed)
Work in appointment scheduled 

## 2020-12-01 NOTE — Telephone Encounter (Signed)
Pt can schedule a virtual visit to discuss if she can send picture of rash through mychart  Please contact pt and see if this is ok or if she wants to come in

## 2020-12-02 ENCOUNTER — Telehealth (INDEPENDENT_AMBULATORY_CARE_PROVIDER_SITE_OTHER): Payer: Medicaid Other | Admitting: Nurse Practitioner

## 2020-12-02 ENCOUNTER — Encounter: Payer: Self-pay | Admitting: Nurse Practitioner

## 2020-12-02 ENCOUNTER — Other Ambulatory Visit: Payer: Self-pay

## 2020-12-02 VITALS — Ht 66.0 in | Wt 230.0 lb

## 2020-12-02 DIAGNOSIS — L280 Lichen simplex chronicus: Secondary | ICD-10-CM

## 2020-12-02 MED ORDER — TRIAMCINOLONE ACETONIDE 0.5 % EX OINT: 1.0000 "application " | TOPICAL_OINTMENT | Freq: Two times a day (BID) | CUTANEOUS | 0 refills | Status: DC

## 2020-12-02 NOTE — Assessment & Plan Note (Signed)
-  INCREASED kenalog dosage -referral to derm

## 2020-12-02 NOTE — Progress Notes (Signed)
Acute Office Visit  Subjective:    Patient ID: Tammy Perry, female    DOB: October 22, 1980, 40 y.o.   MRN: [id number]  Chief Complaint  Patient presents with   Rash    Chronic rash, on top of both feet and the sides of both feet. Was told at last appt she would need dermatology referral if this continued.     Rash  Patient is in today for rash on both feet.  This has been ongoing since April.  She has tried steroid cream and antifungals, but the rash returns.  She states it looked like it was clearing up when she was using the kenalog cream, but it never disappeared.  Past Medical History:  Diagnosis Date   Preeclampsia 03/16/2016    Past Surgical History:  Procedure Laterality Date   CESAREAN SECTION      Family History  Problem Relation Age of Onset   Cancer Mother        lung   Hypertension Mother    Hyperlipidemia Mother    Asthma Son    Diabetes Maternal Uncle     Social History   Socioeconomic History   Marital status: Single    Spouse name: Not on file   Number of children: Not on file   Years of education: Not on file   Highest education level: Not on file  Occupational History   Not on file  Tobacco Use   Smoking status: Never   Smokeless tobacco: Never  Vaping Use   Vaping Use: Never used  Substance and Sexual Activity   Alcohol use: No   Drug use: No   Sexual activity: Yes    Birth control/protection: Implant  Other Topics Concern   Not on file  Social History Narrative   Not on file   Social Determinants of Health   Financial Resource Strain: Not on file  Food Insecurity: Not on file  Transportation Needs: Not on file  Physical Activity: Not on file  Stress: Not on file  Social Connections: Not on file  Intimate Partner Violence: Not on file    Outpatient Medications Prior to Visit  Medication Sig Dispense Refill   megestrol (MEGACE) 40 MG tablet Take 3/day (at the same time) for 5 days; 2/day for 5 days, then 1/day PO prn  bleeding 60 tablet 3   triamcinolone cream (KENALOG) 0.1 % Apply 1 application topically 2 (two) times daily. 30 g 0   No facility-administered medications prior to visit.    No Known Allergies  Review of Systems  Constitutional: Negative.   Skin:  Positive for rash.       To feet; pictures in chart (submitted by pt)      Objective:    Physical Exam  Ht 5' 6"  (1.676 m)   Wt 230 lb (104.3 kg)   BMI 37.12 kg/m  Wt Readings from Last 3 Encounters:  12/02/20 230 lb (104.3 kg)  09/10/20 236 lb 6.4 oz (107.2 kg)  08/06/19 226 lb 9.6 oz (102.8 kg)    Health Maintenance Due  Topic Date Due   INFLUENZA VACCINE  11/24/2020    There are no preventive care reminders to display for this patient.   Lab Results  Component Value Date   TSH 1.460 09/10/2020   Lab Results  Component Value Date   WBC 5.2 09/10/2020   HGB 12.8 09/10/2020   HCT 42.0 09/10/2020   MCV 71 (L) 09/10/2020   PLT 272 09/10/2020  Lab Results  Component Value Date   NA 140 09/10/2020   K 4.4 09/10/2020   CO2 20 09/10/2020   GLUCOSE 100 (H) 09/10/2020   BUN 11 09/10/2020   CREATININE 0.92 09/10/2020   BILITOT 0.3 09/10/2020   ALKPHOS 117 09/10/2020   AST 13 09/10/2020   ALT 10 09/10/2020   PROT 7.2 09/10/2020   ALBUMIN 4.4 09/10/2020   CALCIUM 9.1 09/10/2020   ANIONGAP 7 03/17/2016   EGFR 81 09/10/2020   Lab Results  Component Value Date   CHOL 213 (H) 09/10/2020   Lab Results  Component Value Date   HDL 30 (L) 09/10/2020   Lab Results  Component Value Date   LDLCALC 142 (H) 09/10/2020   Lab Results  Component Value Date   TRIG 223 (H) 09/10/2020   Lab Results  Component Value Date   CHOLHDL 7.1 (H) 09/10/2020   Lab Results  Component Value Date   HGBA1C 6.0 (H) 09/10/2020       Assessment & Plan:   Problem List Items Addressed This Visit       Musculoskeletal and Integument   Lichenified eczema - Primary    -INCREASED kenalog dosage -referral to derm        Relevant Orders   Ambulatory referral to Dermatology     Meds ordered this encounter  Medications   triamcinolone ointment (KENALOG) 0.5 %    Sig: Apply 1 application topically 2 (two) times daily.    Dispense:  30 g    Refill:  0   Date:  12/02/2020   Location of Patient: Home Location of Provider: Office Consent was obtain for visit to be over via telehealth. I verified that I am speaking with the correct person using two identifiers.  I connected with  Tammy Perry on 12/02/20 via telephone and verified that I am speaking with the correct person using two identifiers.   I discussed the limitations of evaluation and management by telemedicine. The patient expressed understanding and agreed to proceed.  Time spent: 8 minutes     Tammy Larsson, NP

## 2021-01-18 ENCOUNTER — Other Ambulatory Visit: Payer: Self-pay

## 2021-01-18 ENCOUNTER — Encounter (HOSPITAL_COMMUNITY): Payer: Self-pay | Admitting: Emergency Medicine

## 2021-01-18 ENCOUNTER — Emergency Department (HOSPITAL_COMMUNITY)
Admission: EM | Admit: 2021-01-18 | Discharge: 2021-01-18 | Disposition: A | Discharge status: Home / Self Care | Payer: Medicaid Other | Attending: Emergency Medicine | Admitting: Emergency Medicine

## 2021-01-18 DIAGNOSIS — J3489 Other specified disorders of nose and nasal sinuses: Secondary | ICD-10-CM | POA: Diagnosis not present

## 2021-01-18 DIAGNOSIS — H6123 Impacted cerumen, bilateral: Secondary | ICD-10-CM | POA: Diagnosis not present

## 2021-01-18 DIAGNOSIS — H9202 Otalgia, left ear: Secondary | ICD-10-CM | POA: Diagnosis present

## 2021-01-18 NOTE — ED Provider Notes (Signed)
[facility] EMERGENCY DEPARTMENT Provider Note   CSN: [id number] Arrival date & time: 01/18/21  1013     History Chief Complaint  Patient presents with   Otalgia    Tammy Perry is a 40 y.o. female.  The history is provided by the patient.  Otalgia Location:  Left Behind ear:  No abnormality Quality:  Dull Onset quality:  Gradual Duration:  1 week Progression:  Partially resolved (pt used debrox and flushing at home, partial improvement with removal of wax, but believes there is more causing sx) Worsened by:  Nothing Ineffective treatments: flushing was partially helpful. Associated symptoms: no congestion, no ear discharge, no fever, no hearing loss, no rhinorrhea and no tinnitus       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   Tinea corporis 09/10/2020   Obesity (BMI 30-39.9) 09/10/2020   Nexplanon insertion 05/19/2016    Past Surgical History:  Procedure Laterality Date   CESAREAN SECTION       OB History     Gravida  3   Para  3   Term  3   Preterm      AB      Living  3      SAB      IAB      Ectopic      Multiple  0   Live Births  3           Family History  Problem Relation Age of Onset   Cancer Mother        lung   Hypertension Mother    Hyperlipidemia Mother    Asthma Son    Diabetes Maternal Uncle     Social History   Tobacco Use   Smoking status: Never   Smokeless tobacco: Never  Vaping Use   Vaping Use: Never used  Substance Use Topics   Alcohol use: No   Drug use: No    Home Medications Prior to Admission medications   Medication Sig Start Date End Date Taking? Authorizing Provider  megestrol (MEGACE) 40 MG tablet Take 3/day (at the same time) for 5 days; 2/day for 5 days, then 1/day PO prn bleeding 06/17/20   Cyril Mourning A, NP   triamcinolone ointment (KENALOG) 0.5 % Apply 1 application topically 2 (two) times daily. 12/02/20   Heather Roberts, NP    Allergies    Patient has no known allergies.  Review of Systems   Review of Systems  Constitutional:  Negative for chills and fever.  HENT:  Positive for ear pain. Negative for congestion, ear discharge, hearing loss, rhinorrhea and tinnitus.   All other systems reviewed and are negative.  Physical Exam Updated Vital Signs BP (!) 133/92 (BP Location: Right Arm)   Pulse 86   Temp 98.1 F (36.7 C) (Oral)   Resp 17   Ht 5\' 6"  (1.676 m)   Wt 99.8 kg   SpO2 98%   BMI 35.51 kg/m   Physical Exam Constitutional:      Appearance: She is well-developed.  HENT:     Head: Normocephalic and atraumatic.     Left Ear: There is impacted cerumen.     Ears:     Comments: Soft appearing cerumen left ear.  Moderate cerumen also right ear without impaction.    Nose: Mucosal edema  and rhinorrhea present.     Mouth/Throat:     Mouth: Mucous membranes are moist.     Pharynx: Oropharynx is clear. Uvula midline. No oropharyngeal exudate or posterior oropharyngeal erythema.     Tonsils: No tonsillar abscesses.  Eyes:     Conjunctiva/sclera: Conjunctivae normal.  Cardiovascular:     Rate and Rhythm: Normal rate.     Heart sounds: Normal heart sounds.  Pulmonary:     Effort: Pulmonary effort is normal. No respiratory distress.     Breath sounds: No wheezing or rales.  Abdominal:     Palpations: Abdomen is soft.     Tenderness: There is no abdominal tenderness.  Musculoskeletal:        General: Normal range of motion.  Skin:    General: Skin is warm and dry.     Findings: No rash.  Neurological:     Mental Status: She is alert and oriented to person, place, and time.    ED Results / Procedures / Treatments   Labs (all labs ordered are listed, but only abnormal results are displayed) Labs Reviewed - No data to display  EKG None  Radiology No results  found.  Procedures .Ear Cerumen Removal  Date/Time: 01/18/2021 11:06 AM Performed by: Burgess Amor, PA-C Authorized by: Burgess Amor, PA-C   Consent:    Consent obtained:  Verbal   Consent given by:  Patient   Risks discussed:  Bleeding, pain, TM perforation and incomplete removal   Alternatives discussed:  No treatment and delayed treatment Universal protocol:    Immediately prior to procedure, a time out was called: yes     Patient identity confirmed:  Verbally with patient Procedure details:    Location:  L ear and R ear   Procedure type: irrigation     Procedure outcomes: cerumen removed   Post-procedure details:    Inspection:  Ear canal clear, no bleeding and TM intact   Hearing quality:  Normal   Procedure completion:  Tolerated Comments:     Initially used curette to removal more distal cerumen.  Flush with warm water to remove deep impaction.  Completed cleared of cerumen.  Also used curette for right ear - easily retrieved a hard cerumen ball - ear canal clear.    Medications Ordered in ED Medications - No data to display  ED Course  I have reviewed the triage vital signs and the nursing notes.  Pertinent labs & imaging results that were available during my care of the patient were reviewed by me and considered in my medical decision making (see chart for details).    MDM Rules/Calculators/A&P                           Prn f/u anticipated Final Clinical Impression(s) / ED Diagnoses Final diagnoses:  Bilateral impacted cerumen    Rx / DC Orders ED Discharge Orders     None        Victoriano Lain 01/18/21 1207    Mancel Bale, MD 01/19/21 1103

## 2021-01-18 NOTE — ED Triage Notes (Signed)
Pt to the ED with complaints of left ear pain.

## 2021-01-19 ENCOUNTER — Telehealth: Payer: Self-pay

## 2021-01-19 NOTE — Telephone Encounter (Signed)
Transition Care Management Unsuccessful Follow-up Telephone Call  Date of discharge and from where:  01/18/2021-[place]  Attempts:  1st Attempt  Reason for unsuccessful TCM follow-up call:  Left voice message

## 2021-01-26 NOTE — Telephone Encounter (Signed)
Transition Care Management Follow-up Telephone Call Date of discharge and from where: 01/18/2021 from [facility] How have you been since you were released from the hospital? Pt stated that she is feeling much better and did not have any questions or concerns at this time.  Any questions or concerns? No  Items Reviewed: Did the pt receive and understand the discharge instructions provided? Yes  Medications obtained and verified? Yes  Other? No  Any new allergies since your discharge? No  Dietary orders reviewed? No Do you have support at home? Yes   Functional Questionnaire: (I = Independent and D = Dependent) ADLs: I  Bathing/Dressing- I  Meal Prep- I  Eating- I  Maintaining continence- I  Transferring/Ambulation- I  Managing Meds- I   Follow up appointments reviewed:  PCP Hospital f/u appt confirmed? Yes  Scheduled to see Dr. Allena Katz on 03/13/2021 @ 8:40am. Specialist Hospital f/u appt confirmed? No   Are transportation arrangements needed? No  If their condition worsens, is the pt aware to call PCP or go to the Emergency Dept.? Yes Was the patient provided with contact information for the PCP's office or ED? Yes Was to pt encouraged to call back with questions or concerns? Yes

## 2021-02-12 ENCOUNTER — Other Ambulatory Visit: Payer: Self-pay | Admitting: Nurse Practitioner

## 2021-02-13 MED ORDER — TRIAMCINOLONE ACETONIDE 0.5 % EX OINT: 1.0000 "application " | TOPICAL_OINTMENT | Freq: Two times a day (BID) | CUTANEOUS | 0 refills | Status: DC

## 2021-03-12 ENCOUNTER — Ambulatory Visit: Payer: Medicaid Other | Admitting: Internal Medicine

## 2021-03-13 ENCOUNTER — Ambulatory Visit: Payer: Medicaid Other | Admitting: Internal Medicine

## 2021-03-18 ENCOUNTER — Emergency Department (HOSPITAL_COMMUNITY)
Admission: EM | Admit: 2021-03-18 | Discharge: 2021-03-18 | Disposition: A | Discharge status: Home / Self Care | Payer: Medicaid Other | Attending: Emergency Medicine | Admitting: Emergency Medicine

## 2021-03-18 ENCOUNTER — Other Ambulatory Visit: Payer: Self-pay

## 2021-03-18 DIAGNOSIS — J101 Influenza due to other identified influenza virus with other respiratory manifestations: Secondary | ICD-10-CM | POA: Diagnosis not present

## 2021-03-18 DIAGNOSIS — R000 Tachycardia, unspecified: Secondary | ICD-10-CM | POA: Diagnosis not present

## 2021-03-18 DIAGNOSIS — J029 Acute pharyngitis, unspecified: Secondary | ICD-10-CM | POA: Diagnosis present

## 2021-03-18 DIAGNOSIS — Z20822 Contact with and (suspected) exposure to covid-19: Secondary | ICD-10-CM | POA: Insufficient documentation

## 2021-03-18 LAB — RESP PANEL BY RT-PCR (FLU A&B, COVID) ARPGX2
Influenza A by PCR: POSITIVE — AB
Influenza B by PCR: NEGATIVE
SARS Coronavirus 2 by RT PCR: NEGATIVE

## 2021-03-18 LAB — GROUP A STREP BY PCR: Group A Strep by PCR: NOT DETECTED

## 2021-03-18 MED ORDER — IBUPROFEN 800 MG PO TABS
800.0000 mg | ORAL_TABLET | Freq: Once | ORAL | Status: AC
  Administered 2021-03-18: 800 mg via ORAL
  Filled 2021-03-18: qty 1

## 2021-03-18 MED ORDER — AEROCHAMBER Z-STAT PLUS/MEDIUM MISC: 1.0000 | Freq: Once | Status: DC

## 2021-03-18 MED ORDER — ALBUTEROL SULFATE HFA 108 (90 BASE) MCG/ACT IN AERS
2.0000 | INHALATION_SPRAY | Freq: Once | RESPIRATORY_TRACT | Status: AC
  Administered 2021-03-18: 2 via RESPIRATORY_TRACT
  Filled 2021-03-18: qty 6.7

## 2021-03-18 MED ORDER — OSELTAMIVIR PHOSPHATE 75 MG PO CAPS: 75.0000 mg | ORAL_CAPSULE | Freq: Two times a day (BID) | ORAL | 0 refills | Status: DC

## 2021-03-18 MED ORDER — HYDROCOD POLST-CPM POLST ER 10-8 MG/5ML PO SUER
5.0000 mL | Freq: Once | ORAL | Status: AC
  Administered 2021-03-18: 5 mL via ORAL
  Filled 2021-03-18: qty 5

## 2021-03-18 MED ORDER — PREDNISONE 10 MG (21) PO TBPK: ORAL_TABLET | Freq: Every day | ORAL | 0 refills | Status: DC

## 2021-03-18 MED ORDER — PREDNISONE 50 MG PO TABS
60.0000 mg | ORAL_TABLET | Freq: Once | ORAL | Status: AC
  Administered 2021-03-18: 60 mg via ORAL
  Filled 2021-03-18: qty 1

## 2021-03-18 NOTE — ED Provider Notes (Signed)
[facility] EMERGENCY DEPARTMENT Provider Note   CSN: [id number] Arrival date & time: 03/18/21  1919     History Chief Complaint  Patient presents with   Sore Throat    Tammy Perry is a 40 y.o. female.  Pt presents to the ED today with fever, body aches.  Her daughter was diagnosed with the flu 2 days ago.  She thinks she now has the flu.  She has not taken anything for her sx.       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   Tinea corporis 09/10/2020   Obesity (BMI 30-39.9) 09/10/2020   Nexplanon insertion 05/19/2016    Past Surgical History:  Procedure Laterality Date   CESAREAN SECTION       OB History     Gravida  3   Para  3   Term  3   Preterm      AB      Living  3      SAB      IAB      Ectopic      Multiple  0   Live Births  3           Family History  Problem Relation Age of Onset   Cancer Mother        lung   Hypertension Mother    Hyperlipidemia Mother    Asthma Son    Diabetes Maternal Uncle     Social History   Tobacco Use   Smoking status: Never   Smokeless tobacco: Never  Vaping Use   Vaping Use: Never used  Substance Use Topics   Alcohol use: No   Drug use: No    Home Medications Prior to Admission medications   Medication Sig Start Date End Date Taking? Authorizing Provider  oseltamivir (TAMIFLU) 75 MG capsule Take 1 capsule (75 mg total) by mouth every 12 (twelve) hours. 03/18/21  Yes Jacalyn Lefevre, MD  predniSONE (STERAPRED UNI-PAK 21 TAB) 10 MG (21) TBPK tablet Take by mouth daily. Take 6 tabs by mouth daily  for 2 days, then 5 tabs for 2 days, then 4 tabs for 2 days, then 3 tabs for 2 days, 2 tabs for 2 days, then 1 tab by mouth daily for 2 days 03/18/21  Yes Jacalyn Lefevre, MD  megestrol (MEGACE) 40 MG tablet Take 3/day (at the same time)  for 5 days; 2/day for 5 days, then 1/day PO prn bleeding 06/17/20   Cyril Mourning A, NP  triamcinolone ointment (KENALOG) 0.5 % Apply 1 application topically 2 (two) times daily. 02/13/21   Anabel Halon, MD    Allergies    Patient has no known allergies.  Review of Systems   Review of Systems  Constitutional:  Positive for fever.  Respiratory:  Positive for cough.   Neurological:  Positive for headaches.  All other systems reviewed and are negative.  Physical Exam Updated Vital Signs BP (!) 139/95 (BP Location: Right Arm)   Pulse (!) 110   Temp (!) 100.4 F (38 C) (Oral)   Resp 19   Ht 5\' 6"  (1.676 m)   Wt 104.3 kg   SpO2 96%   BMI 37.12 kg/m   Physical Exam Vitals and nursing note reviewed.  Constitutional:      Appearance: She is well-developed.  HENT:     Head: Normocephalic and atraumatic.     Nose: Congestion present.     Mouth/Throat:     Mouth: Mucous membranes are moist.  Cardiovascular:     Rate and Rhythm: Regular rhythm. Tachycardia present.  Pulmonary:     Effort: Pulmonary effort is normal.     Breath sounds: Normal breath sounds.  Abdominal:     General: Bowel sounds are normal.     Palpations: Abdomen is soft.  Musculoskeletal:     Cervical back: Normal range of motion.  Skin:    General: Skin is warm.     Capillary Refill: Capillary refill takes less than 2 seconds.  Neurological:     General: No focal deficit present.     Mental Status: She is alert and oriented to person, place, and time.  Psychiatric:        Mood and Affect: Mood normal.        Behavior: Behavior normal.    ED Results / Procedures / Treatments   Labs (all labs ordered are listed, but only abnormal results are displayed) Labs Reviewed  RESP PANEL BY RT-PCR (FLU A&B, COVID) ARPGX2 - Abnormal; Notable for the following components:      Result Value   Influenza A by PCR POSITIVE (*)    All other components within normal limits  GROUP A STREP BY PCR     EKG None  Radiology No results found.  Procedures Procedures   Medications Ordered in ED Medications  aerochamber Z-Stat Plus/medium 1 each (has no administration in time range)  ibuprofen (ADVIL) tablet 800 mg (800 mg Oral Given 03/18/21 2248)  chlorpheniramine-HYDROcodone (TUSSIONEX) 10-8 MG/5ML suspension 5 mL (5 mLs Oral Given 03/18/21 2248)  predniSONE (DELTASONE) tablet 60 mg (60 mg Oral Given 03/18/21 2248)  albuterol (VENTOLIN HFA) 108 (90 Base) MCG/ACT inhaler 2 puff (2 puffs Inhalation Given 03/18/21 2258)    ED Course  I have reviewed the triage vital signs and the nursing notes.  Pertinent labs & imaging results that were available during my care of the patient were reviewed by me and considered in my medical decision making (see chart for details).    MDM Rules/Calculators/A&P                           Pt is + for the flu.  She is given ibuprofen, tussionex, prednisone, and albuterol in the ED.  She is nontoxic in appearance. She is stable for d/c.  Return if worse.  F/u with pcp.  Final Clinical Impression(s) / ED Diagnoses Final diagnoses:  Influenza A    Rx / DC Orders ED Discharge Orders          Ordered    oseltamivir (TAMIFLU) 75 MG capsule  Every 12 hours        03/18/21 2239    predniSONE (STERAPRED UNI-PAK 21 TAB) 10 MG (21) TBPK tablet  Daily        03/18/21 2239             Jacalyn Lefevre, MD 03/18/21 2324

## 2021-03-18 NOTE — ED Triage Notes (Signed)
Pt presents for body aches, sore throat, congestion x 2 days. Daughter was sick with same earlier in week. Denies daily meds or PMH.

## 2021-03-20 ENCOUNTER — Telehealth: Payer: Self-pay

## 2021-03-20 NOTE — Telephone Encounter (Signed)
Transition Care Management Unsuccessful Follow-up Telephone Call  Date of discharge and from where:  03/18/2021-[place]   Attempts:  1st Attempt  Reason for unsuccessful TCM follow-up call:  Left voice message

## 2021-03-23 NOTE — Telephone Encounter (Signed)
Transition Care Management Unsuccessful Follow-up Telephone Call  Date of discharge and from where:  03/18/2021-[place]   Attempts:  2nd Attempt  Reason for unsuccessful TCM follow-up call:  Left voice message

## 2021-03-24 NOTE — Telephone Encounter (Signed)
Transition Care Management Follow-up Telephone Call Date of discharge and from where: 03/18/2021 from [facility] How have you been since you were released from the hospital? Pt stated that she is feeling much better and does not have any question or concerns at this time.  Any questions or concerns? No  Items Reviewed: Did the pt receive and understand the discharge instructions provided? Yes  Medications obtained and verified? Yes  Other? No  Any new allergies since your discharge? No  Dietary orders reviewed? No Do you have support at home? Yes   Functional Questionnaire: (I = Independent and D = Dependent) ADLs: I  Bathing/Dressing- I  Meal Prep- I  Eating- I  Maintaining continence- I  Transferring/Ambulation- I  Managing Meds- I   Follow up appointments reviewed:  PCP Hospital f/u appt confirmed? Yes  Scheduled to see Trena Platt, MD on 04/01/2021 @ 2:40pm. Specialist Hospital f/u appt confirmed? No   Are transportation arrangements needed? No  If their condition worsens, is the pt aware to call PCP or go to the Emergency Dept.? Yes Was the patient provided with contact information for the PCP's office or ED? Yes Was to pt encouraged to call back with questions or concerns? Yes

## 2021-03-26 ENCOUNTER — Telehealth: Payer: Self-pay | Admitting: Adult Health

## 2021-03-26 MED ORDER — MEGESTROL ACETATE 40 MG PO TABS: ORAL_TABLET | ORAL | 3 refills | Status: DC

## 2021-03-26 NOTE — Telephone Encounter (Signed)
Refilled megace  

## 2021-03-26 NOTE — Addendum Note (Signed)
Addended by: Cyril Mourning A on: 03/26/2021 02:43 PM   Modules accepted: Orders

## 2021-03-26 NOTE — Telephone Encounter (Signed)
Patient calling wanting to know if she could get a refill on the megace and sent to [facility] on [address]

## 2021-04-01 ENCOUNTER — Ambulatory Visit: Payer: Medicaid Other | Admitting: Internal Medicine

## 2021-04-27 ENCOUNTER — Ambulatory Visit: Payer: Medicaid Other | Admitting: Internal Medicine

## 2021-05-05 ENCOUNTER — Ambulatory Visit: Payer: Medicaid Other | Admitting: Internal Medicine

## 2021-05-19 ENCOUNTER — Other Ambulatory Visit: Payer: Self-pay

## 2021-05-19 ENCOUNTER — Encounter: Payer: Self-pay | Admitting: Internal Medicine

## 2021-05-19 ENCOUNTER — Ambulatory Visit: Payer: Medicaid Other | Admitting: Internal Medicine

## 2021-05-19 VITALS — BP 132/88 | HR 88 | Resp 18 | Ht 66.0 in | Wt 231.0 lb

## 2021-05-19 DIAGNOSIS — E559 Vitamin D deficiency, unspecified: Secondary | ICD-10-CM | POA: Diagnosis not present

## 2021-05-19 DIAGNOSIS — L280 Lichen simplex chronicus: Secondary | ICD-10-CM | POA: Diagnosis not present

## 2021-05-19 DIAGNOSIS — F4329 Adjustment disorder with other symptoms: Secondary | ICD-10-CM | POA: Diagnosis not present

## 2021-05-19 DIAGNOSIS — Z0000 Encounter for general adult medical examination without abnormal findings: Secondary | ICD-10-CM

## 2021-05-19 DIAGNOSIS — Z2821 Immunization not carried out because of patient refusal: Secondary | ICD-10-CM | POA: Diagnosis not present

## 2021-05-19 DIAGNOSIS — E782 Mixed hyperlipidemia: Secondary | ICD-10-CM | POA: Diagnosis not present

## 2021-05-19 DIAGNOSIS — R7303 Prediabetes: Secondary | ICD-10-CM | POA: Diagnosis not present

## 2021-05-19 DIAGNOSIS — Z0001 Encounter for general adult medical examination with abnormal findings: Secondary | ICD-10-CM | POA: Diagnosis not present

## 2021-05-19 MED ORDER — TRIAMCINOLONE ACETONIDE 0.5 % EX OINT: 1.0000 "application " | TOPICAL_OINTMENT | Freq: Two times a day (BID) | CUTANEOUS | 1 refills | Status: DC

## 2021-05-19 NOTE — Assessment & Plan Note (Signed)
Lipid profile reviewed Advised to follow low-carb diet for now 

## 2021-05-19 NOTE — Assessment & Plan Note (Signed)
Better with Kenalog cream, refills provided 

## 2021-05-19 NOTE — Progress Notes (Signed)
Established Patient Office Visit  Subjective:  Patient ID: Tammy Perry, female    DOB: [birth year]  Age: 41 y.o. MRN: [id number]  CC:  Chief Complaint  Patient presents with   Follow-up    6 month follow up pt having lower back pain that comes and goes especially when sitting has been going on for a while     HPI Tammy Perry is a 41 y.o. female with past medical history of eczema and obesity who presents for f/u of her chronic medical conditions.  Her rash over the foot has improved with Kenalog cream, but she had run out of it.  Her itching has improved as well.  Last blood test showed prediabetes and HLD, for which she agrees to follow low-carb diet for now.  She has been feeling stressed at times, especially because she has been trying to look for a job while trying to keep her kids school schedules and her husband's work schedule.  She denies any anhedonia, anxiety, lack of interest in her routine activities, change in appetite or insomnia currently.  Past Medical History:  Diagnosis Date   Preeclampsia 03/16/2016    Past Surgical History:  Procedure Laterality Date   CESAREAN SECTION      Family History  Problem Relation Age of Onset   Cancer Mother        lung   Hypertension Mother    Hyperlipidemia Mother    Asthma Son    Diabetes Maternal Uncle     Social History   Socioeconomic History   Marital status: Single    Spouse name: Not on file   Number of children: Not on file   Years of education: Not on file   Highest education level: Not on file  Occupational History   Not on file  Tobacco Use   Smoking status: Never   Smokeless tobacco: Never  Vaping Use   Vaping Use: Never used  Substance and Sexual Activity   Alcohol use: No   Drug use: No   Sexual activity: Yes    Birth control/protection: Implant  Other Topics Concern   Not on file  Social History Narrative   Not on file   Social Determinants of Health   Financial Resource  Strain: Not on file  Food Insecurity: Not on file  Transportation Needs: Not on file  Physical Activity: Not on file  Stress: Not on file  Social Connections: Not on file  Intimate Partner Violence: Not on file    Outpatient Medications Prior to Visit  Medication Sig Dispense Refill   megestrol (MEGACE) 40 MG tablet Take 3/day (at the same time) for 5 days; 2/day for 5 days, then 1/day PO prn bleeding 60 tablet 3   triamcinolone ointment (KENALOG) 0.5 % Apply 1 application topically 2 (two) times daily. 30 g 0   oseltamivir (TAMIFLU) 75 MG capsule Take 1 capsule (75 mg total) by mouth every 12 (twelve) hours. (Patient not taking: Reported on 05/19/2021) 10 capsule 0   predniSONE (STERAPRED UNI-PAK 21 TAB) 10 MG (21) TBPK tablet Take by mouth daily. Take 6 tabs by mouth daily  for 2 days, then 5 tabs for 2 days, then 4 tabs for 2 days, then 3 tabs for 2 days, 2 tabs for 2 days, then 1 tab by mouth daily for 2 days (Patient not taking: Reported on 05/19/2021) 42 tablet 0   No facility-administered medications prior to visit.    No Known Allergies  ROS Review  of Systems  Constitutional:  Negative for chills and fever.  HENT:  Negative for congestion, sinus pressure, sinus pain and sore throat.   Eyes:  Negative for pain and discharge.  Respiratory:  Negative for cough and shortness of breath.   Cardiovascular:  Negative for chest pain and palpitations.  Gastrointestinal:  Negative for abdominal pain, diarrhea, nausea and vomiting.  Endocrine: Negative for polydipsia and polyuria.  Genitourinary:  Negative for dysuria and hematuria.  Musculoskeletal:  Negative for neck pain and neck stiffness.  Skin:  Positive for rash.  Neurological:  Negative for dizziness and weakness.  Psychiatric/Behavioral:  Negative for agitation and behavioral problems.      Objective:    Physical Exam Vitals reviewed.  Constitutional:      General: She is not in acute distress.    Appearance: She is  obese. She is not diaphoretic.  HENT:     Head: Normocephalic and atraumatic.     Nose: Nose normal.     Mouth/Throat:     Mouth: Mucous membranes are moist.  Eyes:     General: No scleral icterus.    Extraocular Movements: Extraocular movements intact.  Cardiovascular:     Rate and Rhythm: Normal rate and regular rhythm.     Pulses: Normal pulses.     Heart sounds: Normal heart sounds. No murmur heard. Pulmonary:     Breath sounds: Normal breath sounds. No wheezing or rales.  Musculoskeletal:     Cervical back: Neck supple. No tenderness.     Right lower leg: No edema.     Left lower leg: No edema.  Skin:    General: Skin is warm.     Findings: Rash (hyperpigmented spots over b/l feet - medial aspect) present.  Neurological:     General: No focal deficit present.     Mental Status: She is alert and oriented to person, place, and time.  Psychiatric:        Mood and Affect: Mood normal.        Behavior: Behavior normal.    BP 132/88 (BP Location: Right Arm, Patient Position: Sitting, Cuff Size: Normal)    Pulse 88    Resp 18    Ht 5' 6"  (1.676 m)    Wt 231 lb 0.6 oz (104.8 kg)    SpO2 98%    BMI 37.29 kg/m  Wt Readings from Last 3 Encounters:  05/19/21 231 lb 0.6 oz (104.8 kg)  03/18/21 230 lb (104.3 kg)  01/18/21 220 lb (99.8 kg)    Lab Results  Component Value Date   TSH 1.460 09/10/2020   Lab Results  Component Value Date   WBC 5.2 09/10/2020   HGB 12.8 09/10/2020   HCT 42.0 09/10/2020   MCV 71 (L) 09/10/2020   PLT 272 09/10/2020   Lab Results  Component Value Date   NA 140 09/10/2020   K 4.4 09/10/2020   CO2 20 09/10/2020   GLUCOSE 100 (H) 09/10/2020   BUN 11 09/10/2020   CREATININE 0.92 09/10/2020   BILITOT 0.3 09/10/2020   ALKPHOS 117 09/10/2020   AST 13 09/10/2020   ALT 10 09/10/2020   PROT 7.2 09/10/2020   ALBUMIN 4.4 09/10/2020   CALCIUM 9.1 09/10/2020   ANIONGAP 7 03/17/2016   EGFR 81 09/10/2020   Lab Results  Component Value Date   CHOL  213 (H) 09/10/2020   Lab Results  Component Value Date   HDL 30 (L) 09/10/2020   Lab Results  Component Value  Date   LDLCALC 142 (H) 09/10/2020   Lab Results  Component Value Date   TRIG 223 (H) 09/10/2020   Lab Results  Component Value Date   CHOLHDL 7.1 (H) 09/10/2020   Lab Results  Component Value Date   HGBA1C 6.0 (H) 09/10/2020      Assessment & Plan:   Problem List Items Addressed This Visit       Musculoskeletal and Integument   Lichenified eczema - Primary    Better with Kenalog cream, refills provided      Relevant Medications   triamcinolone ointment (KENALOG) 0.5 %     Other   Stress and adjustment reaction    Stress at home as she is taking care of her kids and has to maintain their and her husband's schedules, she also has been trying to look for a job Does not report anhedonia, lack of interest in activities, change in appetite or weight or insomnia Offered BH therapy for coping with stress, she prefers to wait for now.      Prediabetes    Lab Results  Component Value Date   HGBA1C 6.0 (H) 09/10/2020  Advised to follow low-carb diet for now      Relevant Orders   Hemoglobin A1c   Mixed hyperlipidemia    Lipid profile reviewed Advised to follow low-carb diet for now      Relevant Orders   Lipid panel   Other Visit Diagnoses     Influenza vaccination declined by patient       Vitamin D deficiency       Relevant Orders   VITAMIN D 25 Hydroxy (Vit-D Deficiency, Fractures)       Meds ordered this encounter  Medications   triamcinolone ointment (KENALOG) 0.5 %    Sig: Apply 1 application topically 2 (two) times daily.    Dispense:  30 g    Refill:  1    Follow-up: Return in about 4 months (around 09/16/2021) for Annual physical.    Lindell Spar, MD

## 2021-05-19 NOTE — Patient Instructions (Addendum)
Please apply Kenalog cream for leg rash.  Please continue to follow low carb diet and perform moderate exercise/walking at least 150 mins/week.  Please get fasting blood tests done before the next visit.

## 2021-05-19 NOTE — Assessment & Plan Note (Signed)
Stress at home as she is taking care of her kids and has to maintain their and her husband's schedules, she also has been trying to look for a job Does not report anhedonia, lack of interest in activities, change in appetite or weight or insomnia Offered BH therapy for coping with stress, she prefers to wait for now.

## 2021-05-19 NOTE — Assessment & Plan Note (Signed)
Lab Results  Component Value Date   HGBA1C 6.0 (H) 09/10/2020   Advised to follow low-carb diet for now 

## 2021-09-16 ENCOUNTER — Ambulatory Visit: Payer: Medicaid Other | Admitting: Internal Medicine

## 2021-11-02 ENCOUNTER — Ambulatory Visit (INDEPENDENT_AMBULATORY_CARE_PROVIDER_SITE_OTHER): Payer: Medicaid Other | Admitting: Internal Medicine

## 2021-11-02 ENCOUNTER — Encounter: Payer: Self-pay | Admitting: Internal Medicine

## 2021-11-02 VITALS — BP 130/80 | HR 77 | Ht 66.0 in | Wt 231.0 lb

## 2021-11-02 DIAGNOSIS — Z0001 Encounter for general adult medical examination with abnormal findings: Secondary | ICD-10-CM

## 2021-11-02 DIAGNOSIS — E782 Mixed hyperlipidemia: Secondary | ICD-10-CM

## 2021-11-02 DIAGNOSIS — L819 Disorder of pigmentation, unspecified: Secondary | ICD-10-CM

## 2021-11-02 DIAGNOSIS — L280 Lichen simplex chronicus: Secondary | ICD-10-CM

## 2021-11-02 DIAGNOSIS — R7303 Prediabetes: Secondary | ICD-10-CM | POA: Diagnosis not present

## 2021-11-02 DIAGNOSIS — Z0000 Encounter for general adult medical examination without abnormal findings: Secondary | ICD-10-CM | POA: Diagnosis not present

## 2021-11-02 DIAGNOSIS — E559 Vitamin D deficiency, unspecified: Secondary | ICD-10-CM | POA: Diagnosis not present

## 2021-11-02 MED ORDER — TRIAMCINOLONE ACETONIDE 0.5 % EX OINT: 1.0000 | TOPICAL_OINTMENT | Freq: Two times a day (BID) | CUTANEOUS | 1 refills | Status: DC

## 2021-11-02 NOTE — Progress Notes (Signed)
Established Patient Office Visit  Subjective:  Patient ID: Tammy Perry, female    DOB: April 20, 1981  Age: 41 y.o. MRN: [id number]  CC:  Chief Complaint  Patient presents with   Annual Exam    HPI Tammy Perry is a 41 y.o. female with past medical history of eczema and obesity who presents for annual physical.  Her rash over the foot has improved with Kenalog cream, but she had run out of it.  Her itching has improved as well.  She is concerned about circles under both eyes.  Of note, she does not wear her prescription eyeglasses.  She also reports lack of sleep due to her sleeping pattern. She had been trying to look for a job while trying to keep her kids school schedules and her husband's work schedule.  She denies any anhedonia, anxiety, lack of interest in her routine activities.      Past Medical History:  Diagnosis Date   Preeclampsia 03/16/2016    Past Surgical History:  Procedure Laterality Date   CESAREAN SECTION      Family History  Problem Relation Age of Onset   Cancer Mother        lung   Hypertension Mother    Hyperlipidemia Mother    Asthma Son    Diabetes Maternal Uncle     Social History   Socioeconomic History   Marital status: Single    Spouse name: Not on file   Number of children: Not on file   Years of education: Not on file   Highest education level: Not on file  Occupational History   Not on file  Tobacco Use   Smoking status: Never   Smokeless tobacco: Never  Vaping Use   Vaping Use: Never used  Substance and Sexual Activity   Alcohol use: No   Drug use: No   Sexual activity: Yes    Birth control/protection: Implant  Other Topics Concern   Not on file  Social History Narrative   Not on file   Social Determinants of Health   Financial Resource Strain: Low Risk  (08/06/2019)   Overall Financial Resource Strain (CARDIA)    Difficulty of Paying Living Expenses: Not hard at all  Food Insecurity: No Food Insecurity  (08/06/2019)   Hunger Vital Sign    Worried About Running Out of Food in the Last Year: Never true    [place] in the Last Year: Never true  Transportation Needs: No Transportation Needs (08/06/2019)   PRAPARE - Hydrologist (Medical): No    Lack of Transportation (Non-Medical): No  Physical Activity: Inactive (08/06/2019)   Exercise Vital Sign    Days of Exercise per Week: 0 days    Minutes of Exercise per Session: 0 min  Stress: No Stress Concern Present (08/06/2019)   [place]    Feeling of Stress : Not at all  Social Connections: Moderately Integrated (08/06/2019)   Social Connection and Isolation Panel [NHANES]    Frequency of Communication with Friends and Family: More than three times a week    Frequency of Social Gatherings with Friends and Family: Once a week    Attends Religious Services: More than 4 times per year    Active Member of [employer] or Organizations: No    Attends Archivist Meetings: Never    Marital Status: Married  Human resources officer Violence: Not At Risk (08/06/2019)  Humiliation, Afraid, Rape, and Kick questionnaire    Fear of Current or Ex-Partner: No    Emotionally Abused: No    Physically Abused: No    Sexually Abused: No    Outpatient Medications Prior to Visit  Medication Sig Dispense Refill   megestrol (MEGACE) 40 MG tablet Take 3/day (at the same time) for 5 days; 2/day for 5 days, then 1/day PO prn bleeding 60 tablet 3   triamcinolone ointment (KENALOG) 0.5 % Apply 1 application topically 2 (two) times daily. 30 g 1   No facility-administered medications prior to visit.    No Known Allergies  ROS Review of Systems  Constitutional:  Negative for chills and fever.  HENT:  Negative for congestion, sinus pressure, sinus pain and sore throat.   Eyes:  Negative for pain and discharge.  Respiratory:  Negative for cough and shortness of breath.    Cardiovascular:  Negative for chest pain and palpitations.  Gastrointestinal:  Negative for abdominal pain, diarrhea, nausea and vomiting.  Endocrine: Negative for polydipsia and polyuria.  Genitourinary:  Negative for dysuria and hematuria.  Musculoskeletal:  Negative for neck pain and neck stiffness.  Skin:  Positive for rash.  Neurological:  Negative for dizziness and weakness.  Psychiatric/Behavioral:  Negative for agitation and behavioral problems.       Objective:    Physical Exam Vitals reviewed.  Constitutional:      General: She is not in acute distress.    Appearance: She is obese. She is not diaphoretic.  HENT:     Head: Normocephalic and atraumatic.     Nose: Nose normal.     Mouth/Throat:     Mouth: Mucous membranes are moist.  Eyes:     General: No scleral icterus.    Extraocular Movements: Extraocular movements intact.  Cardiovascular:     Rate and Rhythm: Normal rate and regular rhythm.     Pulses: Normal pulses.     Heart sounds: Normal heart sounds. No murmur heard. Pulmonary:     Breath sounds: Normal breath sounds. No wheezing or rales.  Abdominal:     Palpations: Abdomen is soft.     Tenderness: There is no abdominal tenderness.  Musculoskeletal:     Cervical back: Neck supple. No tenderness.     Right lower leg: No edema.     Left lower leg: No edema.  Skin:    General: Skin is warm.     Findings: Rash (hyperpigmented spots over b/l feet - medial aspect, improved) present.  Neurological:     General: No focal deficit present.     Mental Status: She is alert and oriented to person, place, and time.  Psychiatric:        Mood and Affect: Mood normal.        Behavior: Behavior normal.     BP 130/80   Pulse 77   Ht 5' 6"  (1.676 m)   Wt 231 lb (104.8 kg)   SpO2 97%   BMI 37.28 kg/m  Wt Readings from Last 3 Encounters:  11/02/21 231 lb (104.8 kg)  05/19/21 231 lb 0.6 oz (104.8 kg)  03/18/21 230 lb (104.3 kg)    Lab Results  Component  Value Date   TSH 1.460 09/10/2020   Lab Results  Component Value Date   WBC 5.2 09/10/2020   HGB 12.8 09/10/2020   HCT 42.0 09/10/2020   MCV 71 (L) 09/10/2020   PLT 272 09/10/2020   Lab Results  Component Value Date  NA 140 09/10/2020   K 4.4 09/10/2020   CO2 20 09/10/2020   GLUCOSE 100 (H) 09/10/2020   BUN 11 09/10/2020   CREATININE 0.92 09/10/2020   BILITOT 0.3 09/10/2020   ALKPHOS 117 09/10/2020   AST 13 09/10/2020   ALT 10 09/10/2020   PROT 7.2 09/10/2020   ALBUMIN 4.4 09/10/2020   CALCIUM 9.1 09/10/2020   ANIONGAP 7 03/17/2016   EGFR 81 09/10/2020   Lab Results  Component Value Date   CHOL 213 (H) 09/10/2020   Lab Results  Component Value Date   HDL 30 (L) 09/10/2020   Lab Results  Component Value Date   LDLCALC 142 (H) 09/10/2020   Lab Results  Component Value Date   TRIG 223 (H) 09/10/2020   Lab Results  Component Value Date   CHOLHDL 7.1 (H) 09/10/2020   Lab Results  Component Value Date   HGBA1C 6.0 (H) 09/10/2020      Assessment & Plan:   Problem List Items Addressed This Visit       Musculoskeletal and Integument   Lichenified eczema    Better with Kenalog cream, refills provided      Relevant Medications   triamcinolone ointment (KENALOG) 0.5 %     Other   Prediabetes    Lab Results  Component Value Date   HGBA1C 6.0 (H) 09/10/2020  Advised to follow low-carb diet for now      Mixed hyperlipidemia    Lipid profile reviewed Advised to follow low-carb diet for now      Encounter for general adult medical examination with abnormal findings - Primary    Physical exam as documented. Counseling done  re healthy lifestyle involving commitment to 150 minutes exercise per week, heart healthy diet, and attaining healthy weight.The importance of adequate sleep also discussed. Changes in health habits are decided on by the patient with goals and time frames  set for achieving them. Immunization and cancer screening needs are  specifically addressed at this visit.      Other Visit Diagnoses     Dark circle under eye, unspecified laterality     Reassured, likely due to lack of sleep Avoid using fragranced products to avoid allergic reaction Sleep hygiene material provided       Meds ordered this encounter  Medications   triamcinolone ointment (KENALOG) 0.5 %    Sig: Apply 1 Application topically 2 (two) times daily.    Dispense:  30 g    Refill:  1    Follow-up: Return in about 1 year (around 11/03/2022) for Annual physical.    Lindell Spar, MD

## 2021-11-02 NOTE — Assessment & Plan Note (Signed)

## 2021-11-02 NOTE — Assessment & Plan Note (Signed)
Lipid profile reviewed Advised to follow low-carb diet for now 

## 2021-11-02 NOTE — Patient Instructions (Signed)
Please continue to follow low carb diet and perform moderate exercise/walking at least 150 mins/week.  Please maintain simple sleep hygiene. - Maintain dark and non-noisy environment in the bedroom. - Please use the bedroom for sleep and sexual activity only. - Do not use electronic devices in the bedroom. - Please take dinner at least 2 hours before bedtime. - Please avoid caffeinated products in the evening, including coffee, soft drinks. - Please try to maintain the regular sleep-wake cycle - Go to bed and wake up at the same time.

## 2021-11-02 NOTE — Assessment & Plan Note (Signed)
Lab Results  Component Value Date   HGBA1C 6.0 (H) 09/10/2020   Advised to follow low-carb diet for now

## 2021-11-02 NOTE — Assessment & Plan Note (Signed)
Better with Kenalog cream, refills provided 

## 2021-11-03 LAB — CMP14+EGFR
ALT: 10 IU/L (ref 0–32)
AST: 14 IU/L (ref 0–40)
Albumin/Globulin Ratio: 1.3 (ref 1.2–2.2)
Albumin: 4.1 g/dL (ref 3.9–4.9)
Alkaline Phosphatase: 125 IU/L — ABNORMAL HIGH (ref 44–121)
BUN/Creatinine Ratio: 14 (ref 9–23)
BUN: 13 mg/dL (ref 6–24)
Bilirubin Total: 0.3 mg/dL (ref 0.0–1.2)
CO2: 20 mmol/L (ref 20–29)
Calcium: 9.5 mg/dL (ref 8.7–10.2)
Chloride: 107 mmol/L — ABNORMAL HIGH (ref 96–106)
Creatinine, Ser: 0.91 mg/dL (ref 0.57–1.00)
Globulin, Total: 3.1 g/dL (ref 1.5–4.5)
Glucose: 110 mg/dL — ABNORMAL HIGH (ref 70–99)
Potassium: 4.4 mmol/L (ref 3.5–5.2)
Sodium: 140 mmol/L (ref 134–144)
Total Protein: 7.2 g/dL (ref 6.0–8.5)
eGFR: 82 mL/min/{1.73_m2} (ref >59)

## 2021-11-03 LAB — CBC WITH DIFFERENTIAL/PLATELET
Basophils Absolute: 0 10*3/uL (ref 0.0–0.2)
Basos: 0 %
EOS (ABSOLUTE): 0.1 10*3/uL (ref 0.0–0.4)
Eos: 2 %
Hematocrit: 42.1 % (ref 34.0–46.6)
Hemoglobin: 12.8 g/dL (ref 11.1–15.9)
Immature Grans (Abs): 0 10*3/uL (ref 0.0–0.1)
Immature Granulocytes: 0 %
Lymphocytes Absolute: 2.5 10*3/uL (ref 0.7–3.1)
Lymphs: 38 %
MCH: 21.2 pg — ABNORMAL LOW (ref 26.6–33.0)
MCHC: 30.4 g/dL — ABNORMAL LOW (ref 31.5–35.7)
MCV: 70 fL — ABNORMAL LOW (ref 79–97)
Monocytes Absolute: 0.5 10*3/uL (ref 0.1–0.9)
Monocytes: 7 %
Neutrophils Absolute: 3.5 10*3/uL (ref 1.4–7.0)
Neutrophils: 53 %
Platelets: 263 10*3/uL (ref 150–450)
RBC: 6.04 x10E6/uL — ABNORMAL HIGH (ref 3.77–5.28)
RDW: 14.7 % (ref 11.7–15.4)
WBC: 6.6 10*3/uL (ref 3.4–10.8)

## 2021-11-03 LAB — LIPID PANEL
Chol/HDL Ratio: 6.6 ratio — ABNORMAL HIGH (ref 0.0–4.4)
Cholesterol, Total: 192 mg/dL (ref 100–199)
HDL: 29 mg/dL — ABNORMAL LOW (ref >39)
LDL Chol Calc (NIH): 118 mg/dL — ABNORMAL HIGH (ref 0–99)
Triglycerides: 258 mg/dL — ABNORMAL HIGH (ref 0–149)
VLDL Cholesterol Cal: 45 mg/dL — ABNORMAL HIGH (ref 5–40)

## 2021-11-03 LAB — HEMOGLOBIN A1C
Est. average glucose Bld gHb Est-mCnc: 134 mg/dL
Hgb A1c MFr Bld: 6.3 % — ABNORMAL HIGH (ref 4.8–5.6)

## 2021-11-03 LAB — VITAMIN D 25 HYDROXY (VIT D DEFICIENCY, FRACTURES): Vit D, 25-Hydroxy: 21.5 ng/mL — ABNORMAL LOW (ref 30.0–100.0)

## 2021-11-03 LAB — TSH: TSH: 0.924 u[IU]/mL (ref 0.450–4.500)

## 2021-12-09 ENCOUNTER — Ambulatory Visit: Payer: Medicaid Other | Admitting: Internal Medicine

## 2021-12-09 ENCOUNTER — Encounter: Payer: Self-pay | Admitting: Internal Medicine

## 2021-12-09 VITALS — BP 140/98 | HR 91 | Resp 18 | Ht 66.0 in | Wt 228.8 lb

## 2021-12-09 DIAGNOSIS — M79671 Pain in right foot: Secondary | ICD-10-CM | POA: Diagnosis not present

## 2021-12-09 DIAGNOSIS — R030 Elevated blood-pressure reading, without diagnosis of hypertension: Secondary | ICD-10-CM | POA: Insufficient documentation

## 2021-12-09 DIAGNOSIS — I10 Essential (primary) hypertension: Secondary | ICD-10-CM | POA: Diagnosis not present

## 2021-12-09 DIAGNOSIS — G8929 Other chronic pain: Secondary | ICD-10-CM | POA: Diagnosis not present

## 2021-12-09 MED ORDER — NAPROXEN 500 MG PO TABS: 500.0000 mg | ORAL_TABLET | Freq: Two times a day (BID) | ORAL | 0 refills | Status: DC

## 2021-12-09 NOTE — Patient Instructions (Signed)
Please take Naproxen as needed for heel pain.  Please use ankle brace as discussed.  Please follow low salt diet and check BP at home. If your BP remains above 140/90, please contact us.

## 2021-12-09 NOTE — Assessment & Plan Note (Signed)
BP Readings from Last 1 Encounters:  12/09/21 (!) 140/98   New onset Usually BP remains wnl Will recheck in 4 weeks; if persistently elevated, will start antihypertensive Advised DASH diet and moderate exercise/walking, at least 150 mins/week

## 2021-12-09 NOTE — Progress Notes (Signed)
Acute Office Visit  Subjective:    Patient ID: Tammy Perry, female    DOB: [birth year], 41 y.o.   MRN: [id number]  Chief Complaint  Patient presents with   Foot Pain    Patient has been having right heel pain since 10-09-21 worse when walking    HPI Patient is in today for right foot pain around heel area for the last 2 months.  She denies any recent injury or fall.  Her pain is constant, dull, worse with walking and better with rest and leg elevation.  Her BP was elevated today.  Her BP has been well controlled during previous office visits.  She denies any headache, dizziness, chest pain, dyspnea or palpitations.  Past Medical History:  Diagnosis Date   Preeclampsia 03/16/2016    Past Surgical History:  Procedure Laterality Date   CESAREAN SECTION      Family History  Problem Relation Age of Onset   Cancer Mother        lung   Hypertension Mother    Hyperlipidemia Mother    Asthma Son    Diabetes Maternal Uncle     Social History   Socioeconomic History   Marital status: Single    Spouse name: Not on file   Number of children: Not on file   Years of education: Not on file   Highest education level: Not on file  Occupational History   Not on file  Tobacco Use   Smoking status: Never   Smokeless tobacco: Never  Vaping Use   Vaping Use: Never used  Substance and Sexual Activity   Alcohol use: No   Drug use: No   Sexual activity: Yes    Birth control/protection: Implant  Other Topics Concern   Not on file  Social History Narrative   Not on file   Social Determinants of Health   Financial Resource Strain: Low Risk  (08/06/2019)   Overall Financial Resource Strain (CARDIA)    Difficulty of Paying Living Expenses: Not hard at all  Food Insecurity: No Food Insecurity (08/06/2019)   Hunger Vital Sign    Worried About Running Out of Food in the Last Year: Never true    Ran Out of Food in the Last Year: Never true  Transportation Needs: No  Transportation Needs (08/06/2019)   PRAPARE - Administrator, Civil Service (Medical): No    Lack of Transportation (Non-Medical): No  Physical Activity: Inactive (08/06/2019)   Exercise Vital Sign    Days of Exercise per Week: 0 days    Minutes of Exercise per Session: 0 min  Stress: No Stress Concern Present (08/06/2019)   [employer] of Occupational Health - Occupational Stress Questionnaire    Feeling of Stress : Not at all  Social Connections: Moderately Integrated (08/06/2019)   Social Connection and Isolation Panel [NHANES]    Frequency of Communication with Friends and Family: More than three times a week    Frequency of Social Gatherings with Friends and Family: Once a week    Attends Religious Services: More than 4 times per year    Active Member of [employer] or Organizations: No    Attends Banker Meetings: Never    Marital Status: Married  Catering manager Violence: Not At Risk (08/06/2019)   Humiliation, Afraid, Rape, and Kick questionnaire    Fear of Current or Ex-Partner: No    Emotionally Abused: No    Physically Abused: No    Sexually Abused: No  Outpatient Medications Prior to Visit  Medication Sig Dispense Refill   megestrol (MEGACE) 40 MG tablet Take 3/day (at the same time) for 5 days; 2/day for 5 days, then 1/day PO prn bleeding 60 tablet 3   triamcinolone ointment (KENALOG) 0.5 % Apply 1 Application topically 2 (two) times daily. 30 g 1   No facility-administered medications prior to visit.    No Known Allergies  Review of Systems  Constitutional:  Negative for chills and fever.  HENT:  Negative for congestion, sinus pressure, sinus pain and sore throat.   Eyes:  Negative for pain and discharge.  Respiratory:  Negative for cough and shortness of breath.   Cardiovascular:  Negative for chest pain and palpitations.  Gastrointestinal:  Negative for abdominal pain, diarrhea, nausea and vomiting.  Endocrine: Negative for polydipsia  and polyuria.  Genitourinary:  Negative for dysuria and hematuria.  Musculoskeletal:  Negative for neck pain and neck stiffness.       Right heel pain  Skin:  Positive for rash.  Neurological:  Negative for dizziness and weakness.  Psychiatric/Behavioral:  Negative for agitation and behavioral problems.        Objective:    Physical Exam Vitals reviewed.  Constitutional:      General: She is not in acute distress.    Appearance: She is obese. She is not diaphoretic.  Eyes:     General: No scleral icterus.    Extraocular Movements: Extraocular movements intact.  Cardiovascular:     Rate and Rhythm: Normal rate and regular rhythm.     Pulses: Normal pulses.     Heart sounds: Normal heart sounds. No murmur heard. Pulmonary:     Breath sounds: Normal breath sounds. No wheezing or rales.  Musculoskeletal:     Right lower leg: No edema.     Left lower leg: No edema.     Comments: No tenderness over right heel area  Skin:    General: Skin is warm.     Findings: Rash (hyperpigmented spots over b/l feet - medial aspect, improved) present.  Neurological:     General: No focal deficit present.     Mental Status: She is alert and oriented to person, place, and time.  Psychiatric:        Mood and Affect: Mood normal.        Behavior: Behavior normal.     BP (!) 140/98 (BP Location: Left Arm, Cuff Size: Normal)   Pulse 91   Resp 18   Ht 5\' 6"  (1.676 m)   Wt 228 lb 12.8 oz (103.8 kg)   SpO2 97%   BMI 36.93 kg/m  Wt Readings from Last 3 Encounters:  12/09/21 228 lb 12.8 oz (103.8 kg)  11/02/21 231 lb (104.8 kg)  05/19/21 231 lb 0.6 oz (104.8 kg)        Assessment & Plan:   Problem List Items Addressed This Visit       Cardiovascular and Mediastinum   Essential hypertension    BP Readings from Last 1 Encounters:  12/09/21 (!) 140/98  New onset Usually BP remains wnl Will recheck in 4 weeks; if persistently elevated, will start antihypertensive Advised DASH diet  and moderate exercise/walking, at least 150 mins/week         Other   Chronic heel pain, right - Primary    Appears to be due to plantar fasciitis RICE -rest, ice, compression and leg elevation Naproxen as needed for pain Advised to try ankle brace for plantar  fasciitis Referred to podiatry      Relevant Medications   naproxen (NAPROSYN) 500 MG tablet   Other Relevant Orders   Ambulatory referral to Podiatry     Meds ordered this encounter  Medications   naproxen (NAPROSYN) 500 MG tablet    Sig: Take 1 tablet (500 mg total) by mouth 2 (two) times daily with a meal.    Dispense:  30 tablet    Refill:  0     Irish Breisch Concha Se, MD

## 2021-12-09 NOTE — Assessment & Plan Note (Signed)
Appears to be due to plantar fasciitis RICE -rest, ice, compression and leg elevation Naproxen as needed for pain Advised to try ankle brace for plantar fasciitis Referred to podiatry

## 2021-12-19 ENCOUNTER — Ambulatory Visit (INDEPENDENT_AMBULATORY_CARE_PROVIDER_SITE_OTHER): Payer: Medicaid Other

## 2021-12-19 ENCOUNTER — Encounter: Payer: Self-pay | Admitting: Podiatry

## 2021-12-19 ENCOUNTER — Ambulatory Visit: Payer: Medicaid Other | Admitting: Podiatry

## 2021-12-19 DIAGNOSIS — M722 Plantar fascial fibromatosis: Secondary | ICD-10-CM

## 2021-12-19 MED ORDER — TRIAMCINOLONE ACETONIDE 10 MG/ML IJ SUSP
10.0000 mg | Freq: Once | INTRAMUSCULAR | Status: AC
  Administered 2021-12-19: 10 mg

## 2021-12-19 NOTE — Patient Instructions (Signed)

## 2021-12-19 NOTE — Progress Notes (Signed)
Subjective:   Patient ID: Tammy Perry, female   DOB: 41 y.o.   MRN: [id number]   HPI Patient states the right heel has been hurting for several months worse when she gets up in the morning after periods of sitting and now some during weightbearing.  Patient does not smoke likes to be active moderately obese   Review of Systems  All other systems reviewed and are negative.       Objective:  Physical Exam Vitals and nursing note reviewed.  Constitutional:      Appearance: She is well-developed.  Pulmonary:     Effort: Pulmonary effort is normal.  Musculoskeletal:        General: Normal range of motion.  Skin:    General: Skin is warm.  Neurological:     Mental Status: She is alert.     Neurovascular status intact muscle strength adequate range of motion within normal limits with patient found to have exquisite discomfort plantar aspect right heel insertional point tendon calcaneus inflammation fluid around the medial band     Assessment:  Acute tar fasciitis right with inflammation fluid     Plan:  H&P x-ray reviewed discussed fascial inflammation discussed shoe gear modifications physical therapy and elevated heels.  Sterile prep injected the fascia at insertion 3 mg Kenalog 5 mg Xylocaine and instructed on recovery  X-rays indicate small spur no indication stress fracture arthritis mild depression of the arch

## 2022-01-06 ENCOUNTER — Ambulatory Visit: Payer: Medicaid Other | Admitting: Internal Medicine

## 2022-01-08 ENCOUNTER — Ambulatory Visit: Payer: Medicaid Other | Admitting: Internal Medicine

## 2022-01-08 ENCOUNTER — Encounter: Payer: Self-pay | Admitting: Internal Medicine

## 2022-01-08 DIAGNOSIS — R030 Elevated blood-pressure reading, without diagnosis of hypertension: Secondary | ICD-10-CM | POA: Diagnosis not present

## 2022-01-08 DIAGNOSIS — G8929 Other chronic pain: Secondary | ICD-10-CM | POA: Diagnosis not present

## 2022-01-08 DIAGNOSIS — M79671 Pain in right foot: Secondary | ICD-10-CM

## 2022-01-08 NOTE — Progress Notes (Signed)
Acute Office Visit  Subjective:    Patient ID: Tammy Perry, female    DOB: Feb 22, 1981, 41 y.o.   MRN: [id number]  Chief Complaint  Patient presents with   Follow-up    4 week follow up HTN     HPI Patient is in today for BP evaluation. Her BP was elevated in the last visit. Today, hep BP is slightly better. Of note, her heel pain has improved today. She denies any headache, dizziness, chest pain, dyspnea or palpitations.  Past Medical History:  Diagnosis Date   Preeclampsia 03/16/2016    Past Surgical History:  Procedure Laterality Date   CESAREAN SECTION      Family History  Problem Relation Age of Onset   Cancer Mother        lung   Hypertension Mother    Hyperlipidemia Mother    Asthma Son    Diabetes Maternal Uncle     Social History   Socioeconomic History   Marital status: Single    Spouse name: Not on file   Number of children: Not on file   Years of education: Not on file   Highest education level: Not on file  Occupational History   Not on file  Tobacco Use   Smoking status: Never   Smokeless tobacco: Never  Vaping Use   Vaping Use: Never used  Substance and Sexual Activity   Alcohol use: No   Drug use: No   Sexual activity: Yes    Birth control/protection: Implant  Other Topics Concern   Not on file  Social History Narrative   Not on file   Social Determinants of Health   Financial Resource Strain: Low Risk  (08/06/2019)   Overall Financial Resource Strain (CARDIA)    Difficulty of Paying Living Expenses: Not hard at all  Food Insecurity: No Food Insecurity (08/06/2019)   Hunger Vital Sign    Worried About Running Out of Food in the Last Year: Never true    Ran Out of Food in the Last Year: Never true  Transportation Needs: No Transportation Needs (08/06/2019)   PRAPARE - Administrator, Civil Service (Medical): No    Lack of Transportation (Non-Medical): No  Physical Activity: Inactive (08/06/2019)   Exercise Vital  Sign    Days of Exercise per Week: 0 days    Minutes of Exercise per Session: 0 min  Stress: No Stress Concern Present (08/06/2019)   [employer] of Occupational Health - Occupational Stress Questionnaire    Feeling of Stress : Not at all  Social Connections: Moderately Integrated (08/06/2019)   Social Connection and Isolation Panel [NHANES]    Frequency of Communication with Friends and Family: More than three times a week    Frequency of Social Gatherings with Friends and Family: Once a week    Attends Religious Services: More than 4 times per year    Active Member of [employer] or Organizations: No    Attends Banker Meetings: Never    Marital Status: Married  Catering manager Violence: Not At Risk (08/06/2019)   Humiliation, Afraid, Rape, and Kick questionnaire    Fear of Current or Ex-Partner: No    Emotionally Abused: No    Physically Abused: No    Sexually Abused: No    Outpatient Medications Prior to Visit  Medication Sig Dispense Refill   megestrol (MEGACE) 40 MG tablet Take 3/day (at the same time) for 5 days; 2/day for 5 days, then 1/day PO  prn bleeding 60 tablet 3   naproxen (NAPROSYN) 500 MG tablet Take 1 tablet (500 mg total) by mouth 2 (two) times daily with a meal. 30 tablet 0   triamcinolone ointment (KENALOG) 0.5 % Apply 1 Application topically 2 (two) times daily. 30 g 1   No facility-administered medications prior to visit.    No Known Allergies  Review of Systems  Constitutional:  Negative for chills and fever.  HENT:  Negative for congestion, sinus pressure, sinus pain and sore throat.   Eyes:  Negative for pain and discharge.  Respiratory:  Negative for cough and shortness of breath.   Cardiovascular:  Negative for chest pain and palpitations.  Gastrointestinal:  Negative for abdominal pain, diarrhea, nausea and vomiting.  Endocrine: Negative for polydipsia and polyuria.  Genitourinary:  Negative for dysuria and hematuria.  Musculoskeletal:   Negative for neck pain and neck stiffness.  Skin:  Positive for rash.  Neurological:  Negative for dizziness and weakness.  Psychiatric/Behavioral:  Negative for agitation and behavioral problems.        Objective:    Physical Exam Vitals reviewed.  Constitutional:      General: She is not in acute distress.    Appearance: She is obese. She is not diaphoretic.  Eyes:     General: No scleral icterus.    Extraocular Movements: Extraocular movements intact.  Cardiovascular:     Rate and Rhythm: Normal rate and regular rhythm.     Pulses: Normal pulses.     Heart sounds: Normal heart sounds. No murmur heard. Pulmonary:     Breath sounds: Normal breath sounds. No wheezing or rales.  Musculoskeletal:     Right lower leg: No edema.     Left lower leg: No edema.     Comments: No tenderness over right heel area  Skin:    General: Skin is warm.     Findings: Rash (hyperpigmented spots over b/l feet - medial aspect, improved) present.  Neurological:     General: No focal deficit present.     Mental Status: She is alert and oriented to person, place, and time.  Psychiatric:        Mood and Affect: Mood normal.        Behavior: Behavior normal.     BP 138/88 (BP Location: Right Arm, Patient Position: Sitting, Cuff Size: Normal)   Pulse 95   Resp 18   Ht 5\' 6"  (1.676 m)   Wt 229 lb 9.6 oz (104.1 kg)   SpO2 97%   BMI 37.06 kg/m  Wt Readings from Last 3 Encounters:  01/08/22 229 lb 9.6 oz (104.1 kg)  12/09/21 228 lb 12.8 oz (103.8 kg)  11/02/21 231 lb (104.8 kg)        Assessment & Plan:   Problem List Items Addressed This Visit       Other   Chronic heel pain, right    Appears to be due to plantar fasciitis RICE -rest, ice, compression and leg elevation Naproxen as needed for pain Advised to try ankle brace for plantar fasciitis Referred to podiatry - had steroid injection      Prehypertension    BP Readings from Last 1 Encounters:  01/08/22 138/88  Better  today Usually BP remains wnl If persistently elevated, will start antihypertensive Advised DASH diet and moderate exercise/walking, at least 150 mins/week         No orders of the defined types were placed in this encounter.    Jahron Hunsinger K  Allena Katz, MD

## 2022-01-08 NOTE — Patient Instructions (Addendum)
Please follow DASH diet and perform moderate exercise/walking at least 150 mins/week.  Please contact if your BP remains more than 140/90 on 3 consecutive readings.

## 2022-01-08 NOTE — Assessment & Plan Note (Signed)
Appears to be due to plantar fasciitis RICE -rest, ice, compression and leg elevation Naproxen as needed for pain Advised to try ankle brace for plantar fasciitis Referred to podiatry - had steroid injection

## 2022-01-08 NOTE — Assessment & Plan Note (Signed)
BP Readings from Last 1 Encounters:  01/08/22 138/88   Better today Usually BP remains wnl If persistently elevated, will start antihypertensive Advised DASH diet and moderate exercise/walking, at least 150 mins/week

## 2022-01-26 ENCOUNTER — Telehealth: Payer: Self-pay

## 2022-01-26 MED ORDER — MEGESTROL ACETATE 40 MG PO TABS: ORAL_TABLET | ORAL | 2 refills | Status: DC

## 2022-01-26 NOTE — Telephone Encounter (Signed)
Patient called and wanted a refill sent in for her Megestrol 40 mg refilled, patient has an appointment for her annual on 03/09/22.  If the prescription is called in could you send her a Mychart message and let her know.

## 2022-01-26 NOTE — Telephone Encounter (Signed)
Pt aware that Megace refilled

## 2022-01-28 ENCOUNTER — Encounter: Payer: Self-pay | Admitting: Emergency Medicine

## 2022-01-28 ENCOUNTER — Ambulatory Visit
Admission: EM | Admit: 2022-01-28 | Discharge: 2022-01-28 | Disposition: A | Discharge status: Home / Self Care | Payer: Medicaid Other | Attending: Nurse Practitioner | Admitting: Nurse Practitioner

## 2022-01-28 DIAGNOSIS — N939 Abnormal uterine and vaginal bleeding, unspecified: Secondary | ICD-10-CM

## 2022-01-28 LAB — POCT URINE PREGNANCY: Preg Test, Ur: NEGATIVE

## 2022-01-28 NOTE — ED Provider Notes (Signed)
[facility]    CSN: [id number] Arrival date & time: 01/28/22  0816      History   Chief Complaint No chief complaint on file.   HPI Tammy Perry is a 41 y.o. female.   The history is provided by the patient.   Patient presents for complaints of heavy vaginal bleeding that has been present for the past 3 weeks.  Patient states that she currently has the Nexplanon implant that has been in for at least 3 years, and she thinks it is time to be changed.  She states since she started bleeding, she has been saturating 1 pad for up to 3 hours each time, and it sometimes every hour.  She also states that she has had some generalized fatigue and abdominal cramping.  She states that she is currently sexually active.  Patient denies dizziness, lightheadedness, nausea, vomiting, vaginal symptoms, or vaginal discharge, odor, or itching.  Patient states that she has reached out to her gynecologist, but they informed that they do not have any appointments until November.  Patient has been called a prescription in by them for Megace, but she states that pharmacy informed her that is not going through due to her insurance.  Patient states that she has not reached out to her insurance at this time, but that they have been paying it in the past.  Past Medical History:  Diagnosis Date   Preeclampsia 03/16/2016    Patient Active Problem List   Diagnosis Date Noted   Chronic heel pain, right 12/09/2021   Prehypertension 12/09/2021   Encounter for general adult medical examination with abnormal findings 11/02/2021   Stress and adjustment reaction 05/19/2021   Prediabetes 05/19/2021   Mixed hyperlipidemia [id number]   Lichenified eczema [id number]   Tinea corporis 09/10/2020   Obesity (BMI 30-39.9) 09/10/2020   Nexplanon insertion 05/19/2016    Past Surgical History:  Procedure Laterality Date   CESAREAN SECTION      OB History     Gravida  3   Para  3   Term  3    Preterm      AB      Living  3      SAB      IAB      Ectopic      Multiple  0   Live Births  3            Home Medications    Prior to Admission medications   Medication Sig Start Date End Date Taking? Authorizing Provider  megestrol (MEGACE) 40 MG tablet Take 3/day (at the same time) for 5 days; 2/day for 5 days, then 1/day PO prn bleeding 01/26/22   Estill Dooms, NP  naproxen (NAPROSYN) 500 MG tablet Take 1 tablet (500 mg total) by mouth 2 (two) times daily with a meal. 12/09/21   Lindell Spar, MD  triamcinolone ointment (KENALOG) 0.5 % Apply 1 Application topically 2 (two) times daily. 11/02/21   Lindell Spar, MD    Family History Family History  Problem Relation Age of Onset   Cancer Mother        lung   Hypertension Mother    Hyperlipidemia Mother    Asthma Son    Diabetes Maternal Uncle     Social History Social History   Tobacco Use   Smoking status: Never   Smokeless tobacco: Never  Vaping Use   Vaping Use: Never used  Substance Use Topics  Alcohol use: No   Drug use: No     Allergies   Patient has no known allergies.   Review of Systems Review of Systems Per HPI  Physical Exam Triage Vital Signs ED Triage Vitals  Enc Vitals Group     BP 01/28/22 0826 (!) 146/88     Pulse Rate 01/28/22 0826 95     Resp 01/28/22 0826 16     Temp 01/28/22 0826 98.3 F (36.8 C)     Temp Source 01/28/22 0826 Oral     SpO2 01/28/22 0826 98 %     Weight --      Height --      Head Circumference --      Peak Flow --      Pain Score 01/28/22 0827 1     Pain Loc --      Pain Edu? --      Excl. in [place]? --    No data found.  Updated Vital Signs BP (!) 146/88 (BP Location: Right Arm)   Pulse 95   Temp 98.3 F (36.8 C) (Oral)   Resp 16   SpO2 98%   Visual Acuity Right Eye Distance:   Left Eye Distance:   Bilateral Distance:    Right Eye Near:   Left Eye Near:    Bilateral Near:     Physical Exam Vitals and nursing note  reviewed.  Constitutional:      General: She is not in acute distress.    Appearance: Normal appearance.  HENT:     Head: Normocephalic.     Mouth/Throat:     Mouth: Mucous membranes are moist.  Eyes:     Extraocular Movements: Extraocular movements intact.     Conjunctiva/sclera: Conjunctivae normal.     Pupils: Pupils are equal, round, and reactive to light.  Cardiovascular:     Rate and Rhythm: Normal rate and regular rhythm.     Pulses: Normal pulses.     Heart sounds: Normal heart sounds.  Pulmonary:     Effort: Pulmonary effort is normal. No respiratory distress.     Breath sounds: Normal breath sounds. No stridor. No wheezing, rhonchi or rales.  Abdominal:     General: Bowel sounds are normal.     Palpations: Abdomen is soft.     Tenderness: There is no abdominal tenderness.  Musculoskeletal:     Cervical back: Normal range of motion.  Skin:    General: Skin is warm and dry.  Neurological:     General: No focal deficit present.     Mental Status: She is alert and oriented to person, place, and time.  Psychiatric:        Mood and Affect: Mood normal.        Behavior: Behavior normal.      UC Treatments / Results  Labs (all labs ordered are listed, but only abnormal results are displayed) Labs Reviewed  CBC WITH DIFFERENTIAL/PLATELET  POCT URINE PREGNANCY    EKG   Radiology No results found.  Procedures Procedures (including critical care time)  Medications Ordered in UC Medications - No data to display  Initial Impression / Assessment and Plan / UC Course  I have reviewed the triage vital signs and the nursing notes.  Pertinent labs & imaging results that were available during my care of the patient were reviewed by me and considered in my medical decision making (see chart for details).  Patient presents for complaints of abnormal vaginal bleeding that  is been present for the past 3 weeks.  Patient currently has the Nexplanon implant, but states  that she believes it has been in for the past 3 years.  Based on this, most likely the patient's body is experiencing hormonal changes due to the decreased medication released from the Nexplanon implant.  Most likely, when the implant is changed or updated, her bleeding should resolve or return to baseline.  Urine pregnancy test was negative.  CBC is pending.  Patient was advised that she will be contacted if the results of the CBC are abnormal.  In the interim, patient was advised to follow-up with the pharmacy regarding the Megace.  Patient was provided information regarding the good Rx app and the cost of the medicine through that app.  Symptoms are consistent with abnormal vaginal bleeding.  Patient was given strict indications of when to go to the emergency department.  Patient was also advised to follow-up with her gynecologist to see if she can be seen sooner in the event of a cancellation.  Patient verbalizes understanding.  All questions were answered. Final Clinical Impressions(s) / UC Diagnoses   Final diagnoses:  Abnormal vaginal bleeding     Discharge Instructions      Your urine pregnancy test is negative.  You will be contacted if the pending CBC or blood count test is abnormal. Go to the good Rx app and download that.  The Megace cost approximately $20 at [facility] when you utilized the coupon on the app. Increase fluids and allow for plenty of rest. May take over-the-counter ibuprofen 600 mg (which is 3 tablets) every 8 hours to see if this helps slow your bleeding.  You can take this until you get the insurance issue resolved regarding the Megace coverage. Go to the emergency department immediately if you develop lightheadedness, dizziness, increased bleeding to where you are saturating more than 1 pad every hour, or other concerns. As discussed, please follow-up with your gynecologist to see if they have any other appointments in which you can be seen sooner. Follow-up as  needed.     ED Prescriptions   None    PDMP not reviewed this encounter.   Tish Men, NP 01/28/22 1034

## 2022-01-28 NOTE — Discharge Instructions (Signed)
Your urine pregnancy test is negative.  You will be contacted if the pending CBC or blood count test is abnormal. Go to the good Rx app and download that.  The Megace cost approximately $20 at [facility] when you utilized the coupon on the app. Increase fluids and allow for plenty of rest. May take over-the-counter ibuprofen 600 mg (which is 3 tablets) every 8 hours to see if this helps slow your bleeding.  You can take this until you get the insurance issue resolved regarding the Megace coverage. Go to the emergency department immediately if you develop lightheadedness, dizziness, increased bleeding to where you are saturating more than 1 pad every hour, or other concerns. As discussed, please follow-up with your gynecologist to see if they have any other appointments in which you can be seen sooner. Follow-up as needed.

## 2022-01-28 NOTE — ED Triage Notes (Signed)
Vaginal bleeding x 3 weeks unable to get in to see OB/GYN.  Was given megace and states it did help, but is out of the medication.

## 2022-01-29 LAB — CBC WITH DIFFERENTIAL/PLATELET
Basophils Absolute: 0 10*3/uL (ref 0.0–0.2)
Basos: 0 %
EOS (ABSOLUTE): 0.1 10*3/uL (ref 0.0–0.4)
Eos: 2 %
Hematocrit: 37 % (ref 34.0–46.6)
Hemoglobin: 11.3 g/dL (ref 11.1–15.9)
Immature Grans (Abs): 0 10*3/uL (ref 0.0–0.1)
Immature Granulocytes: 0 %
Lymphocytes Absolute: 2.8 10*3/uL (ref 0.7–3.1)
Lymphs: 41 %
MCH: 21.7 pg — ABNORMAL LOW (ref 26.6–33.0)
MCHC: 30.5 g/dL — ABNORMAL LOW (ref 31.5–35.7)
MCV: 71 fL — ABNORMAL LOW (ref 79–97)
Monocytes Absolute: 0.5 10*3/uL (ref 0.1–0.9)
Monocytes: 7 %
Neutrophils Absolute: 3.3 10*3/uL (ref 1.4–7.0)
Neutrophils: 50 %
Platelets: 290 10*3/uL (ref 150–450)
RBC: 5.2 x10E6/uL (ref 3.77–5.28)
RDW: 15.2 % (ref 11.7–15.4)
WBC: 6.8 10*3/uL (ref 3.4–10.8)

## 2022-03-09 ENCOUNTER — Ambulatory Visit: Payer: Medicaid Other | Admitting: Obstetrics & Gynecology

## 2022-03-10 ENCOUNTER — Encounter: Payer: Self-pay | Admitting: Obstetrics & Gynecology

## 2022-03-10 ENCOUNTER — Ambulatory Visit (INDEPENDENT_AMBULATORY_CARE_PROVIDER_SITE_OTHER): Payer: Medicaid Other | Admitting: Obstetrics & Gynecology

## 2022-03-10 VITALS — BP 140/92 | HR 99 | Ht 66.0 in | Wt 236.6 lb

## 2022-03-10 DIAGNOSIS — R030 Elevated blood-pressure reading, without diagnosis of hypertension: Secondary | ICD-10-CM

## 2022-03-10 DIAGNOSIS — Z975 Presence of (intrauterine) contraceptive device: Secondary | ICD-10-CM | POA: Diagnosis not present

## 2022-03-10 DIAGNOSIS — Z01419 Encounter for gynecological examination (general) (routine) without abnormal findings: Secondary | ICD-10-CM

## 2022-03-10 DIAGNOSIS — Z0000 Encounter for general adult medical examination without abnormal findings: Secondary | ICD-10-CM | POA: Diagnosis not present

## 2022-03-10 DIAGNOSIS — Z1231 Encounter for screening mammogram for malignant neoplasm of breast: Secondary | ICD-10-CM | POA: Diagnosis not present

## 2022-03-10 NOTE — Patient Instructions (Signed)
Please schedule a mammogram at one of the following locations:  [place]: [phone]  [facility] [place]:[phone] [address]  

## 2022-03-10 NOTE — Progress Notes (Signed)
WELL-WOMAN EXAMINATION Patient name: Tammy Perry MRN [id number]  Date of birth: [birth year] Chief Complaint:   Gynecologic Exam (Having abnormal bleeding with Nexplanon; takes Megace)  History of Present Illness:   Tammy Perry is a 41 y.o. G88P3003 female being seen today for a routine well-woman exam.   Last month noted that her period lasted for over 3 weeks- seen in urgent care.  At times, when the bleeding gets heavy, she will take Megace one tablet daily as needed.  Prior to October her periods were either light or non-existent.  Currently on menses and only notes light bleeding while taking the Megace.  Previously tried ring and Depot shot, prefers Nexplanon.  May consider another pregnancy in the future.  No LMP recorded. Patient has had an implant.  The current method of family planning is Nexplanon- placed March 2021   Last pap 07/2019- negative.  Last mammogram: ordered. Last colonoscopy: n/a     03/10/2022   11:40 AM 01/08/2022   11:02 AM 12/09/2021   11:35 AM 11/02/2021   10:59 AM 05/19/2021    9:41 AM  Depression screen PHQ 2/9  Decreased Interest 0 0 0 0 0  Down, Depressed, Hopeless 1 0 0 0 0  PHQ - 2 Score 1 0 0 0 0  Altered sleeping 0      Tired, decreased energy 1      Change in appetite 0      Feeling bad or failure about yourself  0      Trouble concentrating 0      Moving slowly or fidgety/restless 0      Suicidal thoughts 0      PHQ-9 Score 2          Review of Systems:   Pertinent items are noted in HPI Denies any headaches, blurred vision, fatigue, shortness of breath, chest pain, abdominal pain, bowel movements, urination, or intercourse unless otherwise stated above.  Pertinent History Reviewed:  Reviewed past medical,surgical, social and family history.  Reviewed problem list, medications and allergies. Physical Assessment:   Vitals:   03/10/22 1138 03/10/22 1143  BP: (!) 140/94 (!) 140/92  Pulse: 100 99  Weight: 236 lb 9.6  oz (107.3 kg)   Height: 5\' 6"  (1.676 m)   Body mass index is 38.19 kg/m.        Physical Examination:   General appearance - well appearing, and in no distress  Mental status - alert, oriented to person, place, and time  Psych:  She has a normal mood and affect  Skin - warm and dry, normal color, no suspicious lesions noted  Chest - effort normal, all lung fields clear to auscultation bilaterally  Heart - normal rate and regular rhythm  Neck:  midline trachea, no thyromegaly or nodules  Breasts - breasts appear normal, no suspicious masses, no skin or nipple changes or  axillary nodes  Abdomen - obese, soft, nontender, nondistended, no masses or organomegaly  Pelvic - VULVA: normal appearing vulva with no masses, tenderness or lesions  VAGINA: normal appearing vagina with normal color and discharge, no lesions  CERVIX: normal appearing cervix without discharge or lesions, no CMT  UTERUS: uterus is felt to be normal size, shape, consistency and nontender   ADNEXA: No adnexal masses or tenderness noted.  Extremities:  No swelling or varicosities noted, left arm with palpable device  Chaperone:  pt declined      Assessment & Plan:  1) Well-Woman Exam -pap up to  date, reviewed screening guidelines -mammogram ordered  2) Nexplanon in place -discussed that common side effect can be AUB -ok to continue with Megace prn -may also consider removal/replacement or transitioning to another form of contraception -she was not interested at this time in other forms of contraception  3) Elevated blood pressure -pt being followed by PCP -has f/u scheduled for January  Orders Placed This Encounter  Procedures   MM 3D SCREEN BREAST BILATERAL    Meds: No orders of the defined types were placed in this encounter.   Follow-up: Return in about 1 year (around 03/11/2023) for Annual.   Janyth Pupa, DO Attending [place], [place] for [facility],  [place]

## 2022-05-07 ENCOUNTER — Ambulatory Visit
Admission: RE | Admit: 2022-05-07 | Discharge: 2022-05-07 | Disposition: A | Discharge status: Home / Self Care | Payer: Medicaid Other | Source: Ambulatory Visit | Attending: Nurse Practitioner | Admitting: Nurse Practitioner

## 2022-05-07 VITALS — BP 134/87 | HR 90 | Temp 98.8°F | Resp 18

## 2022-05-07 DIAGNOSIS — J029 Acute pharyngitis, unspecified: Secondary | ICD-10-CM | POA: Insufficient documentation

## 2022-05-07 DIAGNOSIS — Z20822 Contact with and (suspected) exposure to covid-19: Secondary | ICD-10-CM | POA: Diagnosis not present

## 2022-05-07 DIAGNOSIS — I10 Essential (primary) hypertension: Secondary | ICD-10-CM | POA: Diagnosis not present

## 2022-05-07 LAB — POCT RAPID STREP A (OFFICE): Rapid Strep A Screen: NEGATIVE

## 2022-05-07 NOTE — ED Provider Notes (Signed)
[facility]    CSN: [id number] Arrival date & time: 05/07/22  1137      History   Chief Complaint Chief Complaint  Patient presents with   Nasal Congestion    Have sinus pressure with a cough, sneezing and running nose. - Entered by patient    HPI Tammy Perry is a 42 y.o. female.   HPI She is in today headaches,  chills, sneezing, runny nose. He denies fever, nasal congestion,cough, new sore throat, new loss of smell or taste, shortness of breath, chest pain, nausea, or diarrhea. This has been going on for 1 days. Exposure unknown COVID/Influenza/Strep.  She did complete a hoe test that indicated 2 lines. The current treatment has been OTC DayQuil.  Past Medical History:  Diagnosis Date   Preeclampsia 03/16/2016    Patient Active Problem List   Diagnosis Date Noted   Chronic heel pain, right 12/09/2021   Prehypertension 12/09/2021   Encounter for general adult medical examination with abnormal findings 11/02/2021   Stress and adjustment reaction 05/19/2021   Prediabetes 05/19/2021   Mixed hyperlipidemia 78/29/5621   Lichenified eczema 30/86/5784   Tinea corporis 09/10/2020   Obesity (BMI 30-39.9) 09/10/2020   Nexplanon insertion 05/19/2016    Past Surgical History:  Procedure Laterality Date   CESAREAN SECTION      OB History     Gravida  3   Para  3   Term  3   Preterm      AB      Living  3      SAB      IAB      Ectopic      Multiple  0   Live Births  3            Home Medications    Prior to Admission medications   Medication Sig Start Date End Date Taking? Authorizing Provider  etonogestrel (NEXPLANON) 68 MG IMPL implant 1 each by Subdermal route once.    [provider]  megestrol (MEGACE) 40 MG tablet Take 3/day (at the same time) for 5 days; 2/day for 5 days, then 1/day PO prn bleeding 01/26/22   Derrek Monaco A, NP  triamcinolone ointment (KENALOG) 0.5 % Apply 1 Application topically 2 (two)  times daily. 11/02/21   Lindell Spar, MD    Family History Family History  Problem Relation Age of Onset   Cancer Mother        lung   Hypertension Mother    Hyperlipidemia Mother    Asthma Son    Diabetes Maternal Uncle     Social History Social History   Tobacco Use   Smoking status: Never   Smokeless tobacco: Never  Vaping Use   Vaping Use: Never used  Substance Use Topics   Alcohol use: Yes    Comment: occ   Drug use: No     Allergies   Patient has no known allergies.   Review of Systems Review of Systems   Physical Exam Triage Vital Signs ED Triage Vitals  Enc Vitals Group     BP 05/07/22 1143 (!) 163/104     Pulse Rate 05/07/22 1143 94     Resp 05/07/22 1143 18     Temp 05/07/22 1143 98.8 F (37.1 C)     Temp Source 05/07/22 1143 Oral     SpO2 05/07/22 1143 99 %     Weight --      Height --  Head Circumference --      Peak Flow --      Pain Score 05/07/22 1144 5     Pain Loc --      Pain Edu? --      Excl. in [place]? --    No data found.  Updated Vital Signs BP 134/87 (BP Location: Right Arm)   Pulse 90   Temp 98.8 F (37.1 C) (Oral)   Resp 18   SpO2 97%   Visual Acuity Right Eye Distance:   Left Eye Distance:   Bilateral Distance:    Right Eye Near:   Left Eye Near:    Bilateral Near:     Physical Exam Constitutional:      Appearance: She is obese.  HENT:     Head: Normocephalic and atraumatic.     Right Ear: Tympanic membrane normal.     Left Ear: Tympanic membrane normal.     Nose: Nose normal.     Mouth/Throat:     Mouth: Mucous membranes are dry.  Eyes:     Pupils: Pupils are equal, round, and reactive to light.  Cardiovascular:     Rate and Rhythm: Normal rate and regular rhythm.     Pulses: Normal pulses.     Heart sounds: Normal heart sounds.  Pulmonary:     Effort: Pulmonary effort is normal.     Breath sounds: Normal breath sounds.  Musculoskeletal:        General: Normal range of motion.     Cervical  back: Normal range of motion.  Skin:    General: Skin is warm.     Capillary Refill: Capillary refill takes less than 2 seconds.  Neurological:     General: No focal deficit present.     Mental Status: She is alert and oriented to person, place, and time.  Psychiatric:        Mood and Affect: Mood normal.        Behavior: Behavior normal.      UC Treatments / Results  Labs (all labs ordered are listed, but only abnormal results are displayed) Labs Reviewed  SARS CORONAVIRUS 2 (TAT 6-24 HRS)  POCT RAPID STREP A (OFFICE)    EKG   Radiology No results found.  Procedures Procedures (including critical care time)  Medications Ordered in UC Medications - No data to display  Initial Impression / Assessment and Plan / UC Course  I have reviewed the triage vital signs and the nursing notes.  Pertinent labs & imaging results that were available during my care of the patient were reviewed by me and considered in my medical decision making (see chart for details).     Sore throat Final Clinical Impressions(s) / UC Diagnoses   Final diagnoses:  Hypertension, unspecified type  Sore throat  Exposure to COVID-19 virus     Discharge Instructions      Your Strep Test is negative. The COVID is pending.  If your COVID test is positive you will receive a call with additional treatment options.  Your headache can be related to your elevated blood pressure. I recommend that you avoid Mucinex.  I recommend using Coricidin HBP for symptom relief.  You may have an Upper Respiratory Infection We encourage conservative treatment with symptom relief. We encourage you to use Tylenol (Remember to use as directed do not exceed daily dosing recommendations) We also encourage salt water gargles for your sore throat. You should also consider throat lozenges and chloraseptic spray.  Your cough can be soothed with a cough suppressant. We have prescribed you a cough suppressant to be taken as   directed.       ED Prescriptions   None    PDMP not reviewed this encounter.   Dionisio David [place], [place] 05/07/22 1241

## 2022-05-07 NOTE — Discharge Instructions (Addendum)
Your Strep Test is negative. The COVID is pending.  If your COVID test is positive you will receive a call with additional treatment options.  Your headache can be related to your elevated blood pressure. I recommend that you avoid Mucinex.  I recommend using Coricidin HBP for symptom relief.  You may have an Upper Respiratory Infection We encourage conservative treatment with symptom relief. We encourage you to use Tylenol (Remember to use as directed do not exceed daily dosing recommendations) We also encourage salt water gargles for your sore throat. You should also consider throat lozenges and chloraseptic spray.  Your cough can be soothed with a cough suppressant. We have prescribed you a cough suppressant to be taken as  directed.

## 2022-05-07 NOTE — ED Triage Notes (Signed)
Sneezing, runny nose, chills, headaches since yesterday.  Has been taking dayquil with some relief.

## 2022-05-08 ENCOUNTER — Telehealth: Payer: Self-pay

## 2022-05-08 LAB — SARS CORONAVIRUS 2 (TAT 6-24 HRS): SARS Coronavirus 2: POSITIVE — AB

## 2022-05-08 MED ORDER — PAXLOVID (300/100) 20 X 150 MG & 10 X 100MG PO TBPK: 3.0000 | ORAL_TABLET | Freq: Two times a day (BID) | ORAL | 0 refills | Status: AC

## 2022-05-08 NOTE — Telephone Encounter (Signed)
Patient called about positive Covid result and medication she can take. Sent in paxlovid to pharmacy for patient by verbal orders of provider R. Orene Desanctis.

## 2022-05-12 ENCOUNTER — Telehealth: Payer: Self-pay | Admitting: Internal Medicine

## 2022-05-12 NOTE — Telephone Encounter (Signed)
scheduled

## 2022-05-12 NOTE — Telephone Encounter (Signed)
Pt called stating she is wanting to know if she can get the cream she had for the fungus? She can't find in her mychart to get name or refill?  [place]ise

## 2022-05-14 ENCOUNTER — Telehealth: Payer: Medicaid Other | Admitting: Internal Medicine

## 2022-05-14 ENCOUNTER — Encounter: Payer: Self-pay | Admitting: Internal Medicine

## 2022-05-14 DIAGNOSIS — L280 Lichen simplex chronicus: Secondary | ICD-10-CM

## 2022-05-14 DIAGNOSIS — B354 Tinea corporis: Secondary | ICD-10-CM | POA: Diagnosis not present

## 2022-05-14 MED ORDER — ITRACONAZOLE 100 MG PO CAPS: 200.0000 mg | ORAL_CAPSULE | Freq: Every day | ORAL | 0 refills | Status: DC

## 2022-05-14 MED ORDER — TRIAMCINOLONE ACETONIDE 0.5 % EX OINT: 1.0000 | TOPICAL_OINTMENT | Freq: Two times a day (BID) | CUTANEOUS | 1 refills | Status: DC

## 2022-05-14 NOTE — Assessment & Plan Note (Signed)
Over neck and back Tried Lamisil Started Itraconazole for 1 week If persistent, will refer to Dermatology

## 2022-05-14 NOTE — Progress Notes (Signed)
Virtual Visit via Video Note   Because of Tammy Perry's co-morbid illnesses, she is at least at moderate risk for complications without adequate follow up.  This format is felt to be most appropriate for this patient at this time.  All issues noted in this document were discussed and addressed.  A limited physical exam was performed with this format.      Evaluation Performed:  Follow-up visit  Date:  05/14/2022   ID:  Tammy Perry, DOB 11-05-1980, MRN [id number]  Patient Location: Home Provider Location: Office/Clinic  Participants: Patient Location of Patient: Home Location of Provider: Telehealth Consent was obtain for visit to be over via telehealth. I verified that I am speaking with the correct person using two identifiers.  PCP:  Lindell Spar, MD   Chief Complaint: Rash on neck and back  History of Present Illness:    Tammy Perry is a 42 y.o. female who has a video visit for complaint of rash on her neck and sides of the back X 2 weeks.  She has been having itching in the neck area.  She has tan-colored, round/oval rash on the neck.  She has history of tinea corporis in the past, which was treated with itraconazole.  She also has chronic rash on the feet, which usually improves with Kenalog cream and requests a refill of it.  The patient does not have symptoms concerning for COVID-19 infection (fever, chills, cough, or new shortness of breath).   Past Medical, Surgical, Social History, Allergies, and Medications have been Reviewed.  Past Medical History:  Diagnosis Date   Preeclampsia 03/16/2016   Past Surgical History:  Procedure Laterality Date   CESAREAN SECTION       Current Meds  Medication Sig   etonogestrel (NEXPLANON) 68 MG IMPL implant 1 each by Subdermal route once.   itraconazole (SPORANOX) 100 MG capsule Take 2 capsules (200 mg total) by mouth daily.   megestrol (MEGACE) 40 MG tablet Take 3/day (at the same time) for 5  days; 2/day for 5 days, then 1/day PO prn bleeding   [DISCONTINUED] triamcinolone ointment (KENALOG) 0.5 % Apply 1 Application topically 2 (two) times daily.     Allergies:   Patient has no known allergies.   ROS:   Please see the history of present illness.     All other systems reviewed and are negative.   Labs/Other Tests and Data Reviewed:    Recent Labs: 11/02/2021: ALT 10; BUN 13; Creatinine, Ser 0.91; Potassium 4.4; Sodium 140; TSH 0.924 01/28/2022: Hemoglobin 11.3; Platelets 290   Recent Lipid Panel Lab Results  Component Value Date/Time   CHOL 192 11/02/2021 10:33 AM   TRIG 258 (H) 11/02/2021 10:33 AM   HDL 29 (L) 11/02/2021 10:33 AM   CHOLHDL 6.6 (H) 11/02/2021 10:33 AM   LDLCALC 118 (H) 11/02/2021 10:33 AM    Wt Readings from Last 3 Encounters:  03/10/22 236 lb 9.6 oz (107.3 kg)  01/08/22 229 lb 9.6 oz (104.1 kg)  12/09/21 228 lb 12.8 oz (103.8 kg)     Objective:    Vital Signs:  There were no vitals taken for this visit.   VITAL SIGNS:  reviewed GEN:  no acute distress EYES:  sclerae anicteric, EOMI - Extraocular Movements Intact SKIN:  Tan-colored, round/oval rash on the neck NEURO:  alert and oriented x 3, no obvious focal deficit PSYCH:  normal affect  ASSESSMENT & PLAN:    Tinea corporis Over neck  and back Tried Lamisil Started Itraconazole for 1 week If persistent, will refer to Dermatology  Lichenified eczema Better with Kenalog cream, refills provided   I discussed the assessment and treatment plan with the patient. The patient was provided an opportunity to ask questions, and all were answered. The patient agreed with the plan and demonstrated an understanding of the instructions.   The patient was advised to call back or seek an in-person evaluation if the symptoms worsen or if the condition fails to improve as anticipated.  The above assessment and management plan was discussed with the patient. The patient verbalized understanding of  and has agreed to the management plan.   Medication Adjustments/Labs and Tests Ordered: Current medicines are reviewed at length with the patient today.  Concerns regarding medicines are outlined above.   Tests Ordered: No orders of the defined types were placed in this encounter.   Medication Changes: Meds ordered this encounter  Medications   itraconazole (SPORANOX) 100 MG capsule    Sig: Take 2 capsules (200 mg total) by mouth daily.    Dispense:  14 capsule    Refill:  0   triamcinolone ointment (KENALOG) 0.5 %    Sig: Apply 1 Application topically 2 (two) times daily.    Dispense:  30 g    Refill:  1     Note: This dictation was prepared with Dragon dictation along with smaller phrase technology. Similar sounding words can be transcribed inadequately or may not be corrected upon review. Any transcriptional errors that result from this process are unintentional.      Disposition:  Follow up  Signed, Lindell Spar, MD  05/14/2022 11:10 AM     [facility]

## 2022-05-14 NOTE — Assessment & Plan Note (Signed)
Better with Kenalog cream, refills provided

## 2022-05-25 ENCOUNTER — Telehealth: Payer: Medicaid Other | Admitting: Internal Medicine

## 2022-07-12 ENCOUNTER — Other Ambulatory Visit: Payer: Self-pay | Admitting: Internal Medicine

## 2022-07-12 ENCOUNTER — Other Ambulatory Visit: Payer: Self-pay

## 2022-07-12 ENCOUNTER — Telehealth: Payer: Self-pay | Admitting: Internal Medicine

## 2022-07-12 ENCOUNTER — Ambulatory Visit: Payer: Medicaid Other | Admitting: Internal Medicine

## 2022-07-12 DIAGNOSIS — B354 Tinea corporis: Secondary | ICD-10-CM

## 2022-07-12 MED ORDER — ITRACONAZOLE 100 MG PO CAPS: 200.0000 mg | ORAL_CAPSULE | Freq: Every day | ORAL | 0 refills | Status: DC

## 2022-07-12 NOTE — Telephone Encounter (Signed)
Prescription Request  07/12/2022  LOV: 01/08/2022  What is the name of the medication or equipment? itraconazole (SPORANOX) 100 MG capsule [id number]   Have you contacted your pharmacy to request a refill? No   Which pharmacy would you like this sent to?  [facility]   Patient notified that their request is being sent to the clinical staff for review and that they should receive a response within 2 business days.   Please advise at Mobile [phone] (mobile)

## 2022-07-12 NOTE — Telephone Encounter (Signed)
Sent to pharmacy 

## 2022-07-27 ENCOUNTER — Ambulatory Visit: Payer: Medicaid Other | Admitting: Internal Medicine

## 2022-08-03 ENCOUNTER — Ambulatory Visit: Payer: Medicaid Other | Admitting: Internal Medicine

## 2022-08-09 ENCOUNTER — Telehealth (INDEPENDENT_AMBULATORY_CARE_PROVIDER_SITE_OTHER): Payer: Medicaid Other | Admitting: Internal Medicine

## 2022-08-09 ENCOUNTER — Encounter: Payer: Self-pay | Admitting: Internal Medicine

## 2022-08-09 VITALS — BP 127/76

## 2022-08-09 DIAGNOSIS — B354 Tinea corporis: Secondary | ICD-10-CM | POA: Diagnosis not present

## 2022-08-09 DIAGNOSIS — R030 Elevated blood-pressure reading, without diagnosis of hypertension: Secondary | ICD-10-CM | POA: Diagnosis not present

## 2022-08-09 DIAGNOSIS — L280 Lichen simplex chronicus: Secondary | ICD-10-CM | POA: Diagnosis not present

## 2022-08-09 MED ORDER — CLOTRIMAZOLE-BETAMETHASONE 1-0.05 % EX CREA: 1.0000 | TOPICAL_CREAM | Freq: Every day | CUTANEOUS | 0 refills | Status: DC

## 2022-08-09 MED ORDER — ITRACONAZOLE 100 MG PO CAPS: 200.0000 mg | ORAL_CAPSULE | Freq: Every day | ORAL | 0 refills | Status: DC

## 2022-08-09 NOTE — Assessment & Plan Note (Addendum)
Over neck and back Tried Lamisil Started Itraconazole for 1 week again Lotrisone cream prescribed If persistent, will refer to Dermatology

## 2022-08-09 NOTE — Assessment & Plan Note (Signed)
BP Readings from Last 1 Encounters:  08/09/22 127/76   Usually BP remains wnl If persistently elevated, will start antihypertensive Advised DASH diet and moderate exercise/walking, at least 150 mins/week

## 2022-08-09 NOTE — Assessment & Plan Note (Addendum)
Better with Kenalog cream

## 2022-08-09 NOTE — Patient Instructions (Addendum)
Please take Itraconazole as prescribed.  Please apply Lotrisone cream over rash area on trunk.

## 2022-08-09 NOTE — Progress Notes (Signed)
Virtual Visit via Video Note   Because of Tammy Perry's co-morbid illnesses, she is at least at moderate risk for complications without adequate follow up.  This format is felt to be most appropriate for this patient at this time.  All issues noted in this document were discussed and addressed.  A limited physical exam was performed with this format.      Evaluation Performed:  Follow-up visit  Date:  08/09/2022   ID:  Tammy Perry, DOB 1980-05-09, MRN [id number]  Patient Location: Home Provider Location: Office/Clinic  Participants: Patient Location of Patient: Home Location of Provider: Telehealth Consent was obtain for visit to be over via telehealth. I verified that I am speaking with the correct person using two identifiers.  PCP:  Anabel Halon, MD   Chief Complaint: Follow-up of HTN  History of Present Illness:    Tammy Perry is a 42 y.o. female with PMH of eczema and obesity who has a video visit for f/u of blood pressure. She had to change her visit to video visit due to her schedule.  BP is well-controlled. Patient denies headache, dizziness, chest pain, dyspnea or palpitations.  She was recently treated for tinea corporis with itraconazole.  She has seen some improvement, but still has small patches of rash over her upper chest wall and back area.  The patient does not have symptoms concerning for COVID-19 infection (fever, chills, cough, or new shortness of breath).   Past Medical, Surgical, Social History, Allergies, and Medications have been Reviewed.  Past Medical History:  Diagnosis Date   Preeclampsia 03/16/2016   Past Surgical History:  Procedure Laterality Date   CESAREAN SECTION       No outpatient medications have been marked as taking for the 08/09/22 encounter (Video Visit) with Anabel Halon, MD.     Allergies:   Patient has no known allergies.   ROS:   Please see the history of present illness.     All other  systems reviewed and are negative.   Labs/Other Tests and Data Reviewed:    Recent Labs: 11/02/2021: ALT 10; BUN 13; Creatinine, Ser 0.91; Potassium 4.4; Sodium 140; TSH 0.924 01/28/2022: Hemoglobin 11.3; Platelets 290   Recent Lipid Panel Lab Results  Component Value Date/Time   CHOL 192 11/02/2021 10:33 AM   TRIG 258 (H) 11/02/2021 10:33 AM   HDL 29 (L) 11/02/2021 10:33 AM   CHOLHDL 6.6 (H) 11/02/2021 10:33 AM   LDLCALC 118 (H) 11/02/2021 10:33 AM    Wt Readings from Last 3 Encounters:  03/10/22 236 lb 9.6 oz (107.3 kg)  01/08/22 229 lb 9.6 oz (104.1 kg)  12/09/21 228 lb 12.8 oz (103.8 kg)     Objective:    Vital Signs:  There were no vitals taken for this visit.   VITAL SIGNS:  reviewed GEN:  no acute distress EYES:  sclerae anicteric, EOMI - Extraocular Movements Intact RESPIRATORY:  normal respiratory effort, symmetric expansion NEURO:  alert and oriented x 3, no obvious focal deficit PSYCH:  normal affect  ASSESSMENT & PLAN:    Lichenified eczema Better with Kenalog cream  Tinea corporis Over neck and back Tried Lamisil Started Itraconazole for 1 week again Lotrisone cream prescribed If persistent, will refer to Dermatology  Prehypertension BP Readings from Last 1 Encounters:  08/09/22 127/76   Usually BP remains wnl If persistently elevated, will start antihypertensive Advised DASH diet and moderate exercise/walking, at least 150 mins/week   I  discussed the assessment and treatment plan with the patient. The patient was provided an opportunity to ask questions, and all were answered. The patient agreed with the plan and demonstrated an understanding of the instructions.   The patient was advised to call back or seek an in-person evaluation if the symptoms worsen or if the condition fails to improve as anticipated.  The above assessment and management plan was discussed with the patient. The patient verbalized understanding of and has agreed to the  management plan.   Medication Adjustments/Labs and Tests Ordered: Current medicines are reviewed at length with the patient today.  Concerns regarding medicines are outlined above.   Tests Ordered: No orders of the defined types were placed in this encounter.   Medication Changes: No orders of the defined types were placed in this encounter.    Note: This dictation was prepared with Dragon dictation along with smaller phrase technology. Similar sounding words can be transcribed inadequately or may not be corrected upon review. Any transcriptional errors that result from this process are unintentional.      Disposition:  Follow up  Signed, Anabel Halon, MD  08/09/2022 11:23 AM     [facility]

## 2022-11-04 ENCOUNTER — Encounter: Payer: Medicaid Other | Admitting: Internal Medicine

## 2022-11-26 ENCOUNTER — Ambulatory Visit
Admission: RE | Admit: 2022-11-26 | Discharge: 2022-11-26 | Disposition: A | Discharge status: Home / Self Care | Payer: Medicaid Other | Source: Ambulatory Visit | Attending: Nurse Practitioner | Admitting: Nurse Practitioner

## 2022-11-26 ENCOUNTER — Other Ambulatory Visit: Payer: Self-pay

## 2022-11-26 VITALS — BP 148/86 | HR 96 | Temp 98.3°F | Resp 20

## 2022-11-26 DIAGNOSIS — U071 COVID-19: Secondary | ICD-10-CM | POA: Diagnosis not present

## 2022-11-26 MED ORDER — PAXLOVID (300/100) 20 X 150 MG & 10 X 100MG PO TBPK: 3.0000 | ORAL_TABLET | Freq: Two times a day (BID) | ORAL | 0 refills | Status: AC

## 2022-11-26 MED ORDER — FLUTICASONE PROPIONATE 50 MCG/ACT NA SUSP: 2.0000 | Freq: Every day | NASAL | 0 refills | Status: AC

## 2022-11-26 MED ORDER — PROMETHAZINE-DM 6.25-15 MG/5ML PO SYRP: 5.0000 mL | ORAL_SOLUTION | Freq: Four times a day (QID) | ORAL | 0 refills | Status: DC | PRN

## 2022-11-26 NOTE — ED Triage Notes (Signed)
Pt reports sore throat, cough, chills since Wednesday night. Pt reports home covid test positive yesterday.

## 2022-11-26 NOTE — ED Provider Notes (Signed)
[facility]    CSN: [id number] Arrival date & time: 11/26/22  0858      History   Chief Complaint Chief Complaint  Patient presents with   Cough    I took a home test and it was postive for Covid. - Entered by patient    HPI Tammy Perry is a 42 y.o. female.   The history is provided by the patient.   Patient presents for upper respiratory symptoms after she had a positive home COVID test 1 day ago.  Patient states symptoms started approximately 2 days ago.  She complains of scratchy throat, fatigue, chills, headache, cough, and decreased appetite.  Patient denies fever, ear pain, ear drainage, wheezing, difficulty breathing, chest pain, abdominal pain, nausea, vomiting, or diarrhea.  Patient is unsure of where she may have come into contact with the virus.  Patient reports she has been taking NyQuil for her symptoms.   Past Medical History:  Diagnosis Date   Preeclampsia 03/16/2016    Patient Active Problem List   Diagnosis Date Noted   Chronic heel pain, right 12/09/2021   Prehypertension 12/09/2021   Encounter for general adult medical examination with abnormal findings 11/02/2021   Stress and adjustment reaction 05/19/2021   Prediabetes 05/19/2021   Mixed hyperlipidemia 05/19/2021   Lichenified eczema 09/10/2020   Tinea corporis 09/10/2020   Obesity (BMI 30-39.9) 09/10/2020   Nexplanon insertion 05/19/2016    Past Surgical History:  Procedure Laterality Date   CESAREAN SECTION      OB History     Gravida  3   Para  3   Term  3   Preterm      AB      Living  3      SAB      IAB      Ectopic      Multiple  0   Live Births  3            Home Medications    Prior to Admission medications   Medication Sig Start Date End Date Taking? Authorizing Provider  fluticasone (FLONASE) 50 MCG/ACT nasal spray Place 2 sprays into both nostrils daily. 11/26/22  Yes Min Collymore-Warren, Sadie Haber, NP  nirmatrelvir & ritonavir (PAXLOVID,  300/100,) 20 x 150 MG & 10 x 100MG  TBPK Take 3 tablets by mouth 2 (two) times daily for 5 days. 11/26/22 12/01/22 Yes Siennah Barrasso-Warren, Sadie Haber, NP  promethazine-dextromethorphan (PROMETHAZINE-DM) 6.25-15 MG/5ML syrup Take 5 mLs by mouth 4 (four) times daily as needed. 11/26/22  Yes Sadira Standard-Warren, Sadie Haber, NP  clotrimazole-betamethasone (LOTRISONE) cream Apply 1 Application topically daily. 08/09/22   Anabel Halon, MD  etonogestrel (NEXPLANON) 68 MG IMPL implant 1 each by Subdermal route once.    [provider]  itraconazole (SPORANOX) 100 MG capsule Take 2 capsules (200 mg total) by mouth daily. 08/09/22   Anabel Halon, MD  megestrol (MEGACE) 40 MG tablet Take 3/day (at the same time) for 5 days; 2/day for 5 days, then 1/day PO prn bleeding 01/26/22   Cyril Mourning A, NP  triamcinolone ointment (KENALOG) 0.5 % Apply 1 Application topically 2 (two) times daily. 05/14/22   Anabel Halon, MD    Family History Family History  Problem Relation Age of Onset   Cancer Mother        lung   Hypertension Mother    Hyperlipidemia Mother    Asthma Son    Diabetes Maternal Uncle     Social  History Social History   Tobacco Use   Smoking status: Never   Smokeless tobacco: Never  Vaping Use   Vaping status: Never Used  Substance Use Topics   Alcohol use: Yes    Comment: occ   Drug use: No     Allergies   Patient has no known allergies.   Review of Systems Review of Systems Per HPI  Physical Exam Triage Vital Signs ED Triage Vitals  Encounter Vitals Group     BP 11/26/22 0909 (!) 148/86     Systolic BP Percentile --      Diastolic BP Percentile --      Pulse Rate 11/26/22 0909 96     Resp 11/26/22 0909 20     Temp 11/26/22 0909 98.3 F (36.8 C)     Temp Source 11/26/22 0909 Oral     SpO2 11/26/22 0909 96 %     Weight --      Height --      Head Circumference --      Peak Flow --      Pain Score 11/26/22 0910 5     Pain Loc --      Pain Education --       Exclude from Growth Chart --    No data found.  Updated Vital Signs BP (!) 148/86 (BP Location: Right Arm)   Pulse 96   Temp 98.3 F (36.8 C) (Oral)   Resp 20   SpO2 96%   Visual Acuity Right Eye Distance:   Left Eye Distance:   Bilateral Distance:    Right Eye Near:   Left Eye Near:    Bilateral Near:     Physical Exam Vitals and nursing note reviewed.  Constitutional:      General: She is not in acute distress.    Appearance: Normal appearance.  HENT:     Head: Normocephalic.     Right Ear: Tympanic membrane, ear canal and external ear normal.     Left Ear: Tympanic membrane, ear canal and external ear normal.     Nose: Congestion present.     Right Turbinates: Enlarged and swollen.     Left Turbinates: Enlarged and swollen.     Right Sinus: No maxillary sinus tenderness or frontal sinus tenderness.     Left Sinus: No maxillary sinus tenderness or frontal sinus tenderness.     Mouth/Throat:     Lips: Pink.     Mouth: Mucous membranes are moist.     Pharynx: Oropharynx is clear. Uvula midline. Posterior oropharyngeal erythema and postnasal drip present. No pharyngeal swelling, oropharyngeal exudate or uvula swelling.     Comments: Cobblestoning present to posterior oropharynx Eyes:     Extraocular Movements: Extraocular movements intact.     Conjunctiva/sclera: Conjunctivae normal.     Pupils: Pupils are equal, round, and reactive to light.  Cardiovascular:     Rate and Rhythm: Normal rate and regular rhythm.     Pulses: Normal pulses.     Heart sounds: Normal heart sounds.  Pulmonary:     Effort: Pulmonary effort is normal. No respiratory distress.     Breath sounds: Normal breath sounds. No stridor. No wheezing, rhonchi or rales.  Abdominal:     General: Bowel sounds are normal.     Palpations: Abdomen is soft.     Tenderness: There is no abdominal tenderness.  Musculoskeletal:     Cervical back: Normal range of motion.  Lymphadenopathy:     Cervical:  No  cervical adenopathy.  Skin:    General: Skin is warm and dry.  Neurological:     General: No focal deficit present.     Mental Status: She is alert and oriented to person, place, and time.  Psychiatric:        Mood and Affect: Mood normal.        Behavior: Behavior normal.      UC Treatments / Results  Labs (all labs ordered are listed, but only abnormal results are displayed) Labs Reviewed - No data to display  EKG   Radiology No results found.  Procedures Procedures (including critical care time)  Medications Ordered in UC Medications - No data to display  Initial Impression / Assessment and Plan / UC Course  I have reviewed the triage vital signs and the nursing notes.  Pertinent labs & imaging results that were available during my care of the patient were reviewed by me and considered in my medical decision making (see chart for details).  The patient is well-appearing, she is in no acute distress, vital signs are stable.  Patient with positive self-administered home COVID test.  Will start patient on Paxlovid antiviral therapy for the next 5 days.  Symptomatic treatment was also provided with fluticasone 50 micro nasal spray for nasal congestion, and Promethazine DM for cough.  Supportive care recommendations were provided and discussed with the patient to include over-the-counter analgesics for pain, fever or discomfort, use of normal saline nasal spray to help with nasal congestion and runny nose, and increasing fluids and allowing for plenty of rest.  Patient was given strict ER follow-up precautions.  Patient is in agreement with this plan of care and verbalizes understanding.  All questions were answered.  Patient stable for discharge.   Final Clinical Impressions(s) / UC Diagnoses   Final diagnoses:  Positive self-administered antigen test for COVID-19     Discharge Instructions      Take medication as prescribed. Increase fluids and allow for plenty of  rest.  As discussed, try to drink at least 8-10 8 ounce glasses of water daily while symptoms persist. Recommend over-the-counter ibuprofen or Tylenol as needed for pain, fever, general discomfort. Normal saline nasal spray throughout the day to help with nasal congestion and runny nose. For your cough, recommend using a humidifier in your bedroom at nighttime during sleep and sleeping elevated on pillows while cough symptoms persist. Go to the emergency department immediately if you experience a high fever greater than 102, worsening cough, shortness of breath, difficulty breathing, or other concerns. As discussed, continue to wear your mask while you are experiencing symptoms and until you complete the medication.  If you continue to experience symptoms after completing the medication, wear your mask for an additional 5 days.  You will need to remain home if you develop a fever.  Do not return to normal activities around others until you have been fever free for 24 hours with no medication. Follow-up as needed.     ED Prescriptions     Medication Sig Dispense Auth. Provider   nirmatrelvir & ritonavir (PAXLOVID, 300/100,) 20 x 150 MG & 10 x 100MG  TBPK Take 3 tablets by mouth 2 (two) times daily for 5 days. 30 tablet Ha Shannahan-Warren, Sadie Haber, NP   fluticasone (FLONASE) 50 MCG/ACT nasal spray Place 2 sprays into both nostrils daily. 16 g Lawernce Earll-Warren, Sadie Haber, NP   promethazine-dextromethorphan (PROMETHAZINE-DM) 6.25-15 MG/5ML syrup Take 5 mLs by mouth 4 (four) times daily as needed.  118 mL Deforrest Bogle-Warren, Sadie Haber, NP      PDMP not reviewed this encounter.   Abran Cantor, NP 11/26/22 [phone]

## 2022-11-26 NOTE — Discharge Instructions (Addendum)
Take medication as prescribed. Increase fluids and allow for plenty of rest.  As discussed, try to drink at least 8-10 8 ounce glasses of water daily while symptoms persist. Recommend over-the-counter ibuprofen or Tylenol as needed for pain, fever, general discomfort. Normal saline nasal spray throughout the day to help with nasal congestion and runny nose. For your cough, recommend using a humidifier in your bedroom at nighttime during sleep and sleeping elevated on pillows while cough symptoms persist. Go to the emergency department immediately if you experience a high fever greater than 102, worsening cough, shortness of breath, difficulty breathing, or other concerns. As discussed, continue to wear your mask while you are experiencing symptoms and until you complete the medication.  If you continue to experience symptoms after completing the medication, wear your mask for an additional 5 days.  You will need to remain home if you develop a fever.  Do not return to normal activities around others until you have been fever free for 24 hours with no medication. Follow-up as needed.

## 2022-12-09 ENCOUNTER — Encounter: Payer: Self-pay | Admitting: Obstetrics & Gynecology

## 2022-12-09 ENCOUNTER — Ambulatory Visit: Payer: Medicaid Other | Admitting: Obstetrics & Gynecology

## 2022-12-09 VITALS — BP 133/89 | HR 84 | Ht 66.0 in | Wt 233.8 lb

## 2022-12-09 DIAGNOSIS — Z3046 Encounter for surveillance of implantable subdermal contraceptive: Secondary | ICD-10-CM | POA: Diagnosis not present

## 2022-12-09 DIAGNOSIS — N939 Abnormal uterine and vaginal bleeding, unspecified: Secondary | ICD-10-CM

## 2022-12-09 DIAGNOSIS — Z3202 Encounter for pregnancy test, result negative: Secondary | ICD-10-CM | POA: Diagnosis not present

## 2022-12-09 LAB — POCT URINE PREGNANCY: Preg Test, Ur: NEGATIVE

## 2022-12-09 MED ORDER — MEGESTROL ACETATE 40 MG PO TABS
ORAL_TABLET | ORAL | 2 refills | Status: DC
Start: 2022-12-09 — End: 2023-10-21

## 2022-12-09 MED ORDER — ETONOGESTREL 68 MG ~~LOC~~ IMPL
68.0000 mg | DRUG_IMPLANT | Freq: Once | SUBCUTANEOUS | Status: AC
Start: 2022-12-09 — End: 2022-12-09
  Administered 2022-12-09: 68 mg via SUBCUTANEOUS

## 2022-12-09 NOTE — Progress Notes (Signed)
   GYN VISIT Patient name: Tammy Perry MRN [id number]  Date of birth: 05-15-1980 Chief Complaint:   No chief complaint on file.  History of Present Illness:   Tammy Perry is a 42 y.o. [id number] female being seen today for contraceptive management.:  -Pt has Nexplanon, which has worked well for both contraception and to regulate her period.  On occasion will have irregular bleeding/dysmenorrhea- will take Megace and notes resolution of her symptoms.  She wishes to continue with Megace prn.  On occasion will have some spotting and cramping, but denies regular periods.  Denies irregular discharge, itching or irritation.  Denies pelvic or abdominal.  No LMP recorded. Patient has had an implant.    Review of Systems:   Pertinent items are noted in HPI Denies fever/chills, dizziness, headaches, visual disturbances, fatigue, shortness of breath, chest pain, abdominal pain, vomiting, no problems with periods, bowel movements, urination, or intercourse unless otherwise stated above.  Pertinent History Reviewed:   Past Surgical History:  Procedure Laterality Date   CESAREAN SECTION      Past Medical History:  Diagnosis Date   Preeclampsia 03/16/2016   Reviewed problem list, medications and allergies. Physical Assessment:   Vitals:   12/09/22 1015  BP: 133/89  Pulse: 84  Weight: 233 lb 12.8 oz (106.1 kg)  Height: 5\' 6"  (1.676 m)  Body mass index is 37.74 kg/m.       Physical Examination:   General appearance: alert, well appearing, and in no distress  Psych: mood appropriate, normal affect  Skin: warm & dry   Cardiovascular: normal heart rate noted  Respiratory: normal respiratory effort, no distress  Abdomen: soft, non-tender   Pelvic: examination not indicated  Extremities: left arm with palpable device  NEXPLANON REMOVAL AND REPLACEMENT   Risks/benefits/side effects of Nexplanon have been discussed and her questions have been answered.  Specifically, a failure  rate of 04/998 has been reported.  She is aware of the common side effect of irregular bleeding, which the incidence of decreases over time. Signed copy of informed consent in chart.   Nexplanon site identified.  Area prepped in usual sterile fashon. Two cc's of 2% lidocaine was used to anesthetize the area. A small stab incision was made right beside the implant on the distal portion.  The Nexplanon rod was grasped using hemostats and removed intact without difficulty.  The area was cleansed again with betadine and the Nexplanon was inserted approximately 10cm from the medial epicondyle and 3-5cm posterior to the sulcus per manufacturer's recommendations without difficulty.  Steri-strips and a pressure bandage was applied.  There was less than 3 cc blood loss. There were no complications.  The patient tolerated the procedure well.  Assessment & Plan:   1) Nexplanon removal & re-insertion She was instructed to keep the area clean and dry, remove pressure bandage in 24 hours, and keep insertion site covered with the steri-strips for 3-5 days.  She was given a card indicating date Nexplanon was inserted and date it needs to be removed.  Follow-up PRN problems.  2) AUB -continue with Megace as needed  Orders Placed This Encounter  Procedures   POCT urine pregnancy    Follow-up: Return for Nov 2024- annual with Pap.   Myna Hidalgo, DO Attending Obstetrician & Gynecologist, [facility] for [employer], [facility]

## 2022-12-14 ENCOUNTER — Encounter: Payer: Medicaid Other | Admitting: Internal Medicine

## 2023-01-25 ENCOUNTER — Ambulatory Visit (INDEPENDENT_AMBULATORY_CARE_PROVIDER_SITE_OTHER): Payer: Medicaid Other | Admitting: Internal Medicine

## 2023-01-25 ENCOUNTER — Encounter: Payer: Self-pay | Admitting: Internal Medicine

## 2023-01-25 VITALS — BP 136/88 | HR 109 | Ht 66.0 in | Wt 237.2 lb

## 2023-01-25 DIAGNOSIS — E559 Vitamin D deficiency, unspecified: Secondary | ICD-10-CM | POA: Diagnosis not present

## 2023-01-25 DIAGNOSIS — E782 Mixed hyperlipidemia: Secondary | ICD-10-CM

## 2023-01-25 DIAGNOSIS — L280 Lichen simplex chronicus: Secondary | ICD-10-CM

## 2023-01-25 DIAGNOSIS — R7303 Prediabetes: Secondary | ICD-10-CM

## 2023-01-25 DIAGNOSIS — R030 Elevated blood-pressure reading, without diagnosis of hypertension: Secondary | ICD-10-CM | POA: Diagnosis not present

## 2023-01-25 DIAGNOSIS — Z0001 Encounter for general adult medical examination with abnormal findings: Secondary | ICD-10-CM | POA: Diagnosis not present

## 2023-01-25 DIAGNOSIS — E669 Obesity, unspecified: Secondary | ICD-10-CM | POA: Diagnosis not present

## 2023-01-25 MED ORDER — SEMAGLUTIDE-WEIGHT MANAGEMENT 0.5 MG/0.5ML ~~LOC~~ SOAJ
0.5000 mg | SUBCUTANEOUS | 0 refills | Status: AC
Start: 2023-02-23 — End: 2023-03-23

## 2023-01-25 MED ORDER — SEMAGLUTIDE-WEIGHT MANAGEMENT 1 MG/0.5ML ~~LOC~~ SOAJ
1.0000 mg | SUBCUTANEOUS | 0 refills | Status: AC
Start: 2023-03-24 — End: 2023-04-21

## 2023-01-25 MED ORDER — SEMAGLUTIDE-WEIGHT MANAGEMENT 0.25 MG/0.5ML ~~LOC~~ SOAJ
0.2500 mg | SUBCUTANEOUS | 0 refills | Status: AC
Start: 2023-01-25 — End: 2023-02-22

## 2023-01-25 MED ORDER — CLOBETASOL PROPIONATE 0.05 % EX CREA: 1.0000 | TOPICAL_CREAM | Freq: Two times a day (BID) | CUTANEOUS | 1 refills | Status: DC

## 2023-01-25 NOTE — Progress Notes (Signed)
Established Patient Office Visit  Subjective:  Patient ID: Tammy Perry, female    DOB: 08-18-1980  Age: 42 y.o. MRN: [id number]  CC:  Chief Complaint  Patient presents with   Annual Exam   Weight Loss    Patient would like to discuss weight loss options    HPI Tammy Perry is a 42 y.o. female with past medical history of eczema and obesity who presents for annual physical.  Her rash over the foot has improved with Kenalog cream, but still has rash over right foot.  Her itching has improved as well.  She is concerned about her weight.  She has tried following low-carb diet and regular exercise regimen. She still has gained about 8 lbs since the last office visit in 09/24.     Past Medical History:  Diagnosis Date   Preeclampsia 03/16/2016    Past Surgical History:  Procedure Laterality Date   CESAREAN SECTION      Family History  Problem Relation Age of Onset   Cancer Mother        lung   Hypertension Mother    Hyperlipidemia Mother    Asthma Son    Diabetes Maternal Uncle     Social History   Socioeconomic History   Marital status: Married    Spouse name: Not on file   Number of children: Not on file   Years of education: Not on file   Highest education level: Not on file  Occupational History   Not on file  Tobacco Use   Smoking status: Never   Smokeless tobacco: Never  Vaping Use   Vaping status: Never Used  Substance and Sexual Activity   Alcohol use: Yes    Comment: occ   Drug use: No   Sexual activity: Yes    Birth control/protection: Implant  Other Topics Concern   Not on file  Social History Narrative   Not on file   Social Determinants of Health   Financial Resource Strain: Low Risk  (03/10/2022)   Overall Financial Resource Strain (CARDIA)    Difficulty of Paying Living Expenses: Not hard at all  Food Insecurity: No Food Insecurity (03/10/2022)   Hunger Vital Sign    Worried About Running Out of Food in the Last Year:  Never true    Ran Out of Food in the Last Year: Never true  Transportation Needs: No Transportation Needs (03/10/2022)   PRAPARE - Administrator, Civil Service (Medical): No    Lack of Transportation (Non-Medical): No  Physical Activity: Inactive (03/10/2022)   Exercise Vital Sign    Days of Exercise per Week: 0 days    Minutes of Exercise per Session: 0 min  Stress: No Stress Concern Present (03/10/2022)   [employer] of Occupational Health - Occupational Stress Questionnaire    Feeling of Stress : Only a little  Social Connections: Moderately Integrated (03/10/2022)   Social Connection and Isolation Panel [NHANES]    Frequency of Communication with Friends and Family: Twice a week    Frequency of Social Gatherings with Friends and Family: Twice a week    Attends Religious Services: More than 4 times per year    Active Member of [employer] or Organizations: No    Attends Banker Meetings: Never    Marital Status: Married  Catering manager Violence: Not At Risk (03/10/2022)   Humiliation, Afraid, Rape, and Kick questionnaire    Fear of Current or Ex-Partner: No  Emotionally Abused: No    Physically Abused: No    Sexually Abused: No    Outpatient Medications Prior to Visit  Medication Sig Dispense Refill   etonogestrel (NEXPLANON) 68 MG IMPL implant 1 each by Subdermal route once.     fluticasone (FLONASE) 50 MCG/ACT nasal spray Place 2 sprays into both nostrils daily. 16 g 0   megestrol (MEGACE) 40 MG tablet Take 3/day (at the same time) for 5 days; 2/day for 5 days, then 1/day PO prn bleeding 60 tablet 2   clotrimazole-betamethasone (LOTRISONE) cream Apply 1 Application topically daily. 30 g 0   itraconazole (SPORANOX) 100 MG capsule Take 2 capsules (200 mg total) by mouth daily. 14 capsule 0   promethazine-dextromethorphan (PROMETHAZINE-DM) 6.25-15 MG/5ML syrup Take 5 mLs by mouth 4 (four) times daily as needed. 118 mL 0   triamcinolone ointment  (KENALOG) 0.5 % Apply 1 Application topically 2 (two) times daily. 30 g 1   No facility-administered medications prior to visit.    No Known Allergies  ROS Review of Systems  Constitutional:  Negative for chills and fever.  HENT:  Negative for congestion, sinus pressure, sinus pain and sore throat.   Eyes:  Negative for pain and discharge.  Respiratory:  Negative for cough and shortness of breath.   Cardiovascular:  Negative for chest pain and palpitations.  Gastrointestinal:  Negative for abdominal pain, diarrhea, nausea and vomiting.  Endocrine: Negative for polydipsia and polyuria.  Genitourinary:  Negative for dysuria and hematuria.  Musculoskeletal:  Negative for neck pain and neck stiffness.  Skin:  Positive for rash.  Neurological:  Negative for dizziness and weakness.  Psychiatric/Behavioral:  Negative for agitation and behavioral problems.       Objective:    Physical Exam Vitals reviewed.  Constitutional:      General: She is not in acute distress.    Appearance: She is obese. She is not diaphoretic.  HENT:     Head: Normocephalic and atraumatic.     Nose: Nose normal.     Mouth/Throat:     Mouth: Mucous membranes are moist.  Eyes:     General: No scleral icterus.    Extraocular Movements: Extraocular movements intact.  Cardiovascular:     Rate and Rhythm: Normal rate and regular rhythm.     Pulses: Normal pulses.     Heart sounds: Normal heart sounds. No murmur heard. Pulmonary:     Breath sounds: Normal breath sounds. No wheezing or rales.  Abdominal:     Palpations: Abdomen is soft.     Tenderness: There is no abdominal tenderness.  Musculoskeletal:     Cervical back: Neck supple. No tenderness.     Right lower leg: No edema.     Left lower leg: No edema.  Skin:    General: Skin is warm.     Findings: Rash (hyperpigmented spots over b/l feet - medial aspect, improved) present.  Neurological:     General: No focal deficit present.     Mental  Status: She is alert and oriented to person, place, and time.  Psychiatric:        Mood and Affect: Mood normal.        Behavior: Behavior normal.     BP 136/88 (BP Location: Right Arm)   Pulse (!) 109   Ht 5\' 6"  (1.676 m)   Wt 237 lb 3.2 oz (107.6 kg)   SpO2 96%   BMI 38.29 kg/m  Wt Readings from Last 3 Encounters:  01/25/23 237 lb 3.2 oz (107.6 kg)  12/09/22 233 lb 12.8 oz (106.1 kg)  03/10/22 236 lb 9.6 oz (107.3 kg)    Lab Results  Component Value Date   TSH 0.890 01/25/2023   Lab Results  Component Value Date   WBC 5.9 01/25/2023   HGB 12.6 01/25/2023   HCT 44.2 01/25/2023   MCV 74 (L) 01/25/2023   PLT 267 01/25/2023   Lab Results  Component Value Date   NA 141 01/25/2023   K 4.5 01/25/2023   CO2 21 01/25/2023   GLUCOSE 95 01/25/2023   BUN 11 01/25/2023   CREATININE 0.93 01/25/2023   BILITOT 0.2 01/25/2023   ALKPHOS 111 01/25/2023   AST 15 01/25/2023   ALT 13 01/25/2023   PROT 6.9 01/25/2023   ALBUMIN 4.1 01/25/2023   CALCIUM 9.2 01/25/2023   ANIONGAP 7 03/17/2016   EGFR 79 01/25/2023   Lab Results  Component Value Date   CHOL 148 01/25/2023   Lab Results  Component Value Date   HDL 30 (L) 01/25/2023   Lab Results  Component Value Date   LDLCALC 86 01/25/2023   Lab Results  Component Value Date   TRIG 185 (H) 01/25/2023   Lab Results  Component Value Date   CHOLHDL 4.9 (H) 01/25/2023   Lab Results  Component Value Date   HGBA1C 6.2 (H) 01/25/2023      Assessment & Plan:   Problem List Items Addressed This Visit    Problem List Items Addressed This Visit       Musculoskeletal and Integument   Lichenified eczema    Better with Kenalog cream, but still has it on right foot Started clobetasol cream instead of Kenalog      Relevant Medications   clobetasol cream (TEMOVATE) 0.05 %     Other   Obesity (BMI 30-39.9)    BMI Readings from Last 3 Encounters:  01/25/23 38.29 kg/m  12/09/22 37.74 kg/m  03/10/22 38.19 kg/m    Has tried low-carb diet and moderate exercise Started Wegovy - initial BMI: 38.29, plan to increase dose as tolerated Emphasized importance of low-carb diet      Relevant Medications   Semaglutide-Weight Management 0.25 MG/0.5ML SOAJ   Semaglutide-Weight Management 0.5 MG/0.5ML SOAJ (Start on 02/23/2023)   Semaglutide-Weight Management 1 MG/0.5ML SOAJ (Start on 03/24/2023)   Prediabetes    Lab Results  Component Value Date   HGBA1C 6.2 (H) 01/25/2023   Advised to follow low-carb diet for now      Relevant Orders   Hemoglobin A1c (Completed)   CMP14+EGFR (Completed)   Mixed hyperlipidemia    Lipid profile reviewed Advised to follow low-carb diet for now      Relevant Orders   Lipid panel (Completed)   Encounter for general adult medical examination with abnormal findings - Primary    Physical exam as documented. Counseling done  re healthy lifestyle involving commitment to 150 minutes exercise per week, heart healthy diet, and attaining healthy weight.The importance of adequate sleep also discussed. Changes in health habits are decided on by the patient with goals and time frames  set for achieving them. Immunization and cancer screening needs are specifically addressed at this visit.  Denied flu and Tdap vaccines today.      Prehypertension    BP Readings from Last 1 Encounters:  01/25/23 136/88   Usually BP remains wnl If persistently elevated, will start antihypertensive Advised DASH diet and moderate exercise/walking, at least 150 mins/week  Relevant Orders   TSH (Completed)   CMP14+EGFR (Completed)   CBC with Differential/Platelet (Completed)   Other Visit Diagnoses     Vitamin D deficiency       Relevant Orders   VITAMIN D 25 Hydroxy (Vit-D Deficiency, Fractures) (Completed)         Meds ordered this encounter  Medications   Semaglutide-Weight Management 0.25 MG/0.5ML SOAJ    Sig: Inject 0.25 mg into the skin once a week for 28 days.     Dispense:  2 mL    Refill:  0   Semaglutide-Weight Management 0.5 MG/0.5ML SOAJ    Sig: Inject 0.5 mg into the skin once a week for 28 days.    Dispense:  2 mL    Refill:  0   Semaglutide-Weight Management 1 MG/0.5ML SOAJ    Sig: Inject 1 mg into the skin once a week for 28 days.    Dispense:  2 mL    Refill:  0   clobetasol cream (TEMOVATE) 0.05 %    Sig: Apply 1 Application topically 2 (two) times daily.    Dispense:  30 g    Refill:  1    Follow-up: Return in about 2 months (around 03/27/2023) for HTN and weight management.    Anabel Halon, MD

## 2023-01-25 NOTE — Patient Instructions (Signed)
Please start taking Wegovy 0.25 mg once weekly. Increase dose to 0.5 mg in the next month and 1 mg after that.  Please apply Clobetasol cream over leg rash.  Please continue to take medications as prescribed.  Please continue to follow low carb diet and perform moderate exercise/walking at least 150 mins/week.

## 2023-01-26 LAB — CMP14+EGFR
ALT: 13 [IU]/L (ref 0–32)
AST: 15 [IU]/L (ref 0–40)
Albumin: 4.1 g/dL (ref 3.9–4.9)
Alkaline Phosphatase: 111 [IU]/L (ref 44–121)
BUN/Creatinine Ratio: 12 (ref 9–23)
BUN: 11 mg/dL (ref 6–24)
Bilirubin Total: 0.2 mg/dL (ref 0.0–1.2)
CO2: 21 mmol/L (ref 20–29)
Calcium: 9.2 mg/dL (ref 8.7–10.2)
Chloride: 108 mmol/L — ABNORMAL HIGH (ref 96–106)
Creatinine, Ser: 0.93 mg/dL (ref 0.57–1.00)
Globulin, Total: 2.8 g/dL (ref 1.5–4.5)
Glucose: 95 mg/dL (ref 70–99)
Potassium: 4.5 mmol/L (ref 3.5–5.2)
Sodium: 141 mmol/L (ref 134–144)
Total Protein: 6.9 g/dL (ref 6.0–8.5)
eGFR: 79 mL/min/{1.73_m2} (ref >59)

## 2023-01-26 LAB — CBC WITH DIFFERENTIAL/PLATELET
Basophils Absolute: 0 10*3/uL (ref 0.0–0.2)
Basos: 0 %
EOS (ABSOLUTE): 0.1 10*3/uL (ref 0.0–0.4)
Eos: 1 %
Hematocrit: 44.2 % (ref 34.0–46.6)
Hemoglobin: 12.6 g/dL (ref 11.1–15.9)
Immature Grans (Abs): 0 10*3/uL (ref 0.0–0.1)
Immature Granulocytes: 0 %
Lymphocytes Absolute: 2.2 10*3/uL (ref 0.7–3.1)
Lymphs: 37 %
MCH: 21.2 pg — ABNORMAL LOW (ref 26.6–33.0)
MCHC: 28.5 g/dL — ABNORMAL LOW (ref 31.5–35.7)
MCV: 74 fL — ABNORMAL LOW (ref 79–97)
Monocytes Absolute: 0.4 10*3/uL (ref 0.1–0.9)
Monocytes: 7 %
Neutrophils Absolute: 3.2 10*3/uL (ref 1.4–7.0)
Neutrophils: 55 %
Platelets: 267 10*3/uL (ref 150–450)
RBC: 5.95 x10E6/uL — ABNORMAL HIGH (ref 3.77–5.28)
RDW: 15.5 % — ABNORMAL HIGH (ref 11.7–15.4)
WBC: 5.9 10*3/uL (ref 3.4–10.8)

## 2023-01-26 LAB — HEMOGLOBIN A1C
Est. average glucose Bld gHb Est-mCnc: 131 mg/dL
Hgb A1c MFr Bld: 6.2 % — ABNORMAL HIGH (ref 4.8–5.6)

## 2023-01-26 LAB — VITAMIN D 25 HYDROXY (VIT D DEFICIENCY, FRACTURES): Vit D, 25-Hydroxy: 27.9 ng/mL — ABNORMAL LOW (ref 30.0–100.0)

## 2023-01-26 LAB — LIPID PANEL
Chol/HDL Ratio: 4.9 {ratio} — ABNORMAL HIGH (ref 0.0–4.4)
Cholesterol, Total: 148 mg/dL (ref 100–199)
HDL: 30 mg/dL — ABNORMAL LOW (ref >39)
LDL Chol Calc (NIH): 86 mg/dL (ref 0–99)
Triglycerides: 185 mg/dL — ABNORMAL HIGH (ref 0–149)
VLDL Cholesterol Cal: 32 mg/dL (ref 5–40)

## 2023-01-26 LAB — TSH: TSH: 0.89 u[IU]/mL (ref 0.450–4.500)

## 2023-01-28 NOTE — Assessment & Plan Note (Signed)
Lipid profile reviewed Advised to follow low-carb diet for now 

## 2023-01-28 NOTE — Assessment & Plan Note (Signed)
BP Readings from Last 1 Encounters:  01/25/23 136/88   Usually BP remains wnl If persistently elevated, will start antihypertensive Advised DASH diet and moderate exercise/walking, at least 150 mins/week

## 2023-01-28 NOTE — Assessment & Plan Note (Addendum)

## 2023-01-28 NOTE — Assessment & Plan Note (Signed)
Better with Kenalog cream, but still has it on right foot Started clobetasol cream instead of Kenalog

## 2023-01-28 NOTE — Assessment & Plan Note (Signed)
BMI Readings from Last 3 Encounters:  01/25/23 38.29 kg/m  12/09/22 37.74 kg/m  03/10/22 38.19 kg/m   Has tried low-carb diet and moderate exercise Started Wegovy - initial BMI: 38.29, plan to increase dose as tolerated Emphasized importance of low-carb diet

## 2023-01-28 NOTE — Assessment & Plan Note (Signed)
Lab Results  Component Value Date   HGBA1C 6.2 (H) 01/25/2023   Advised to follow low-carb diet for now

## 2023-03-14 ENCOUNTER — Ambulatory Visit: Payer: Medicaid Other | Admitting: Adult Health

## 2023-03-29 ENCOUNTER — Ambulatory Visit: Payer: Medicaid Other | Admitting: Internal Medicine

## 2023-03-31 ENCOUNTER — Ambulatory Visit: Payer: Medicaid Other | Admitting: Advanced Practice Midwife

## 2023-04-26 ENCOUNTER — Ambulatory Visit: Payer: Medicaid Other | Admitting: Internal Medicine

## 2023-05-05 ENCOUNTER — Encounter: Payer: Self-pay | Admitting: Advanced Practice Midwife

## 2023-05-05 ENCOUNTER — Other Ambulatory Visit (HOSPITAL_COMMUNITY)
Admission: RE | Admit: 2023-05-05 | Discharge: 2023-05-05 | Disposition: A | Discharge status: Home / Self Care | Payer: Medicaid Other | Source: Ambulatory Visit | Attending: Advanced Practice Midwife | Admitting: Advanced Practice Midwife

## 2023-05-05 ENCOUNTER — Ambulatory Visit (INDEPENDENT_AMBULATORY_CARE_PROVIDER_SITE_OTHER): Payer: Medicaid Other | Admitting: Advanced Practice Midwife

## 2023-05-05 VITALS — BP 140/87 | HR 110 | Ht 66.0 in | Wt 241.0 lb

## 2023-05-05 DIAGNOSIS — Z01419 Encounter for gynecological examination (general) (routine) without abnormal findings: Secondary | ICD-10-CM | POA: Insufficient documentation

## 2023-05-05 DIAGNOSIS — Z124 Encounter for screening for malignant neoplasm of cervix: Secondary | ICD-10-CM

## 2023-05-05 DIAGNOSIS — Z1151 Encounter for screening for human papillomavirus (HPV): Secondary | ICD-10-CM

## 2023-05-05 NOTE — Patient Instructions (Signed)
 Call [phone] to schedule a screening mammogram

## 2023-05-05 NOTE — Progress Notes (Signed)
 Tammy Perry 43 y.o.  Vitals:   05/05/23 1034  BP: (!) 140/87  Pulse: (!) 110     Filed Weights   05/05/23 1034  Weight: 241 lb (109.3 kg)    Past Medical History: Past Medical History:  Diagnosis Date   Preeclampsia 03/16/2016    Past Surgical History: Past Surgical History:  Procedure Laterality Date   CESAREAN SECTION      Family History: Family History  Problem Relation Age of Onset   Cancer Mother        lung   Hypertension Mother    Hyperlipidemia Mother    Asthma Son    Diabetes Maternal Uncle     Social History: Social History   Tobacco Use   Smoking status: Never   Smokeless tobacco: Never  Vaping Use   Vaping status: Never Used  Substance Use Topics   Alcohol use: Yes    Comment: occ   Drug use: No    Allergies: No Known Allergies    Current Outpatient Medications:    etonogestrel  (NEXPLANON ) 68 MG IMPL implant, 1 each by Subdermal route once., Disp: , Rfl:    megestrol  (MEGACE ) 40 MG tablet, Take 3/day (at the same time) for 5 days; 2/day for 5 days, then 1/day PO prn bleeding, Disp: 60 tablet, Rfl: 2   clobetasol  cream (TEMOVATE ) 0.05 %, Apply 1 Application topically 2 (two) times daily. (Patient not taking: Reported on 05/05/2023), Disp: 30 g, Rfl: 1   fluticasone  (FLONASE ) 50 MCG/ACT nasal spray, Place 2 sprays into both nostrils daily. (Patient not taking: Reported on 05/05/2023), Disp: 16 g, Rfl: 0  History of Present Illness: Here for pap/physical. PCP manages labs, etc. Elevated BP today and in the past, PCP labeled her prehypertensive but considering starting meds . Has appt in Feb.  Has started having a sharp LLQ pain for a few weeks, several times a day a few days a week, only lasts a few seconds then goes away. Offered US  now or wait a bit and see what happens.  She chooses to wait and see.Got nexplanon  changed out in Nov. Plans to schedule a Mammogram , phone number given  Prior cytology:      Component Value Date/Time    DIAGPAP  08/06/2019 1018    - Negative for intraepithelial lesion or malignancy (NILM)   HPVHIGH Negative 08/06/2019 1018   ADEQPAP  08/06/2019 1018    Satisfactory for evaluation; transformation zone component PRESENT.      Review of Systems   Patient denies any headaches, blurred vision, shortness of breath, chest pain, abdominal pain, problems with bowel movements, urination, or intercourse.   Physical Exam: General:  Well developed, well nourished, no acute distress Skin:  Warm and dry Neck:  Midline trachea Lungs; Clear to auscultation bilaterally Breast:  No dominant palpable mass, retraction, or nipple discharge Cardiovascular: Regular rate and rhythm Abdomen:  Soft, non tender, no hepatosplenomegaly Pelvic:  External genitalia is normal in appearance.  The vagina is normal in appearance.  The cervix is bulbous.  Uterus is felt to be normal size, shape, and contour.  No adnexal masses or tenderness noted Not tender in LLQ. Exam limited by habitus  Extremities:  No swelling or varicosities noted Rectal:  hemocult negative Psych:  No mood changes.     Impression: Normal GYN exam      Plan: If pap normal, repeat in 3 years   Mammogram advised now

## 2023-05-09 ENCOUNTER — Other Ambulatory Visit: Payer: Self-pay | Admitting: Women's Health

## 2023-05-09 LAB — CYTOLOGY - PAP
Comment: NEGATIVE
Diagnosis: NEGATIVE
High risk HPV: NEGATIVE

## 2023-05-09 MED ORDER — FLUCONAZOLE 150 MG PO TABS: 150.0000 mg | ORAL_TABLET | Freq: Once | ORAL | 0 refills | Status: AC

## 2023-05-30 ENCOUNTER — Ambulatory Visit: Payer: Self-pay | Admitting: Internal Medicine

## 2023-06-28 ENCOUNTER — Other Ambulatory Visit: Payer: Self-pay | Admitting: Internal Medicine

## 2023-06-28 DIAGNOSIS — L280 Lichen simplex chronicus: Secondary | ICD-10-CM

## 2023-07-01 ENCOUNTER — Ambulatory Visit: Payer: Self-pay | Admitting: Internal Medicine

## 2023-09-06 ENCOUNTER — Ambulatory Visit: Payer: Self-pay | Admitting: Internal Medicine

## 2023-09-09 ENCOUNTER — Encounter: Payer: Self-pay | Admitting: Internal Medicine

## 2023-09-09 ENCOUNTER — Telehealth: Admitting: Internal Medicine

## 2023-09-09 VITALS — Ht 66.0 in | Wt 220.0 lb

## 2023-09-09 DIAGNOSIS — R7303 Prediabetes: Secondary | ICD-10-CM

## 2023-09-09 DIAGNOSIS — M79671 Pain in right foot: Secondary | ICD-10-CM | POA: Diagnosis not present

## 2023-09-09 DIAGNOSIS — G8929 Other chronic pain: Secondary | ICD-10-CM | POA: Diagnosis not present

## 2023-09-09 DIAGNOSIS — E669 Obesity, unspecified: Secondary | ICD-10-CM | POA: Diagnosis not present

## 2023-09-09 MED ORDER — NAPROXEN 500 MG PO TABS
500.0000 mg | ORAL_TABLET | Freq: Two times a day (BID) | ORAL | 1 refills | Status: AC
Start: 2023-09-09 — End: ?

## 2023-09-09 NOTE — Assessment & Plan Note (Addendum)
 Recurrent, appears to be due to plantar fasciitis RICE -rest, ice, compression and leg elevation Naproxen  as needed for pain Advised to try ankle brace for plantar fasciitis Had seen podiatry in 2023 - had steroid injection

## 2023-09-09 NOTE — Patient Instructions (Addendum)
 Please take naproxen  as needed for right heel pain.  Please perform leg elevation and apply ice over the area.  Please use ankle brace for plantar fasciitis.    Please continue to follow low carb diet and perform moderate exercise/walking at least 150 mins/week.

## 2023-09-09 NOTE — Assessment & Plan Note (Signed)
 BMI Readings from Last 3 Encounters:  09/09/23 35.51 kg/m  05/05/23 38.90 kg/m  01/25/23 38.29 kg/m   Has tried low-carb diet and moderate exercise for the last 1 year Has lost about 17 lbs since the last visit Had started Wegovy  - initial BMI: 38.29, but could not get it due to short supply at pharmacy She prefers to focus on lifestyle modifications for now, I agree to it. Emphasized importance of low-carb diet

## 2023-09-09 NOTE — Progress Notes (Signed)
 Virtual Visit via Video Note   Because of Tammy Perry's co-morbid illnesses, she is at least at moderate risk for complications without adequate follow up.  This format is felt to be most appropriate for this patient at this time.  All issues noted in this document were discussed and addressed.  A limited physical exam was performed with this format.      Evaluation Performed:  Follow-up visit  Date:  09/09/2023   ID:  [name], DOB [birth year], MRN [id number]  Patient Location: Home Provider Location: Office/Clinic  Participants: Patient Location of Patient: Home Location of Provider: Telehealth Consent was obtain for visit to be over via telehealth. I verified that I am speaking with the correct person using two identifiers.  PCP:  Meldon Sport, MD   Chief Complaint: Follow up of chronic medical conditions  History of Present Illness:    Tammy Perry is a 43 y.o. female with PMH of prediabetes, eczema and obesity who has a video visit for follow-up of her chronic medical conditions.  Obesity: She has been following strict low-carb diet and has started regular exercise.  She has reportedly lost about 17 LB's since the last visit.  She was given Wegovy  in the last visit, but could not start it due to short supply at the pharmacy.  She prefers to avoid taking any medicine for weight loss for now.  She is encouraged to continue lifestyle modifications for now.  Right heel pain: She reports recurrence of right heel pain since the last week.  She had to walk long distance last week while on a field trip with her daughter.  She has a history of plantar fasciitis, had to have steroid injection in 2023.  The patient does not have symptoms concerning for COVID-19 infection (fever, chills, cough, or new shortness of breath).   Past Medical, Surgical, Social History, Allergies, and Medications have been Reviewed.  Past Medical History:  Diagnosis Date    Preeclampsia 03/16/2016   Past Surgical History:  Procedure Laterality Date   CESAREAN SECTION       Current Meds  Medication Sig   clobetasol  cream (TEMOVATE ) 0.05 % APPLY TOPICALLY TO THE AFFECTED AREA TWICE DAILY   etonogestrel  (NEXPLANON ) 68 MG IMPL implant 1 each by Subdermal route once.   fluticasone  (FLONASE ) 50 MCG/ACT nasal spray Place 2 sprays into both nostrils daily.   megestrol  (MEGACE ) 40 MG tablet Take 3/day (at the same time) for 5 days; 2/day for 5 days, then 1/day PO prn bleeding     Allergies:   Patient has no known allergies.   ROS:   Please see the history of present illness. All other systems reviewed and are negative.   Labs/Other Tests and Data Reviewed:    Recent Labs: 01/25/2023: ALT 13; BUN 11; Creatinine, Ser 0.93; Hemoglobin 12.6; Platelets 267; Potassium 4.5; Sodium 141; TSH 0.890   Recent Lipid Panel Lab Results  Component Value Date/Time   CHOL 148 01/25/2023 10:35 AM   TRIG 185 (H) 01/25/2023 10:35 AM   HDL 30 (L) 01/25/2023 10:35 AM   CHOLHDL 4.9 (H) 01/25/2023 10:35 AM   LDLCALC 86 01/25/2023 10:35 AM    Wt Readings from Last 3 Encounters:  09/09/23 220 lb (99.8 kg)  05/05/23 241 lb (109.3 kg)  01/25/23 237 lb 3.2 oz (107.6 kg)     Objective:    Vital Signs:  Ht 5\' 6"  (1.676 m)   Wt 220 lb (99.8 kg)  BMI 35.51 kg/m    VITAL SIGNS:  reviewed GEN:  no acute distress EYES:  sclerae anicteric, EOMI - Extraocular Movements Intact RESPIRATORY:  normal respiratory effort, symmetric expansion NEURO:  alert and oriented x 3, no obvious focal deficit PSYCH:  normal affect  ASSESSMENT & PLAN:    Chronic heel pain, right Recurrent, appears to be due to plantar fasciitis RICE -rest, ice, compression and leg elevation Naproxen  as needed for pain Advised to try ankle brace for plantar fasciitis Had seen podiatry in 2023 - had steroid injection  Prediabetes Lab Results  Component Value Date   HGBA1C 6.2 (H) 01/25/2023   Advised  to follow low-carb diet for now  Obesity (BMI 30-39.9) BMI Readings from Last 3 Encounters:  09/09/23 35.51 kg/m  05/05/23 38.90 kg/m  01/25/23 38.29 kg/m   Has tried low-carb diet and moderate exercise for the last 1 year Has lost about 17 lbs since the last visit Had started Wegovy  - initial BMI: 38.29, but could not get it due to short supply at pharmacy She prefers to focus on lifestyle modifications for now, I agree to it. Emphasized importance of low-carb diet   I discussed the assessment and treatment plan with the patient. The patient was provided an opportunity to ask questions, and all were answered. The patient agreed with the plan and demonstrated an understanding of the instructions.   The patient was advised to call back or seek an in-person evaluation if the symptoms worsen or if the condition fails to improve as anticipated.  The above assessment and management plan was discussed with the patient. The patient verbalized understanding of and has agreed to the management plan.   Medication Adjustments/Labs and Tests Ordered: Current medicines are reviewed at length with the patient today.  Concerns regarding medicines are outlined above.   Tests Ordered: No orders of the defined types were placed in this encounter.   Medication Changes: No orders of the defined types were placed in this encounter.    Note: This dictation was prepared with Dragon dictation along with smaller phrase technology. Similar sounding words can be transcribed inadequately or may not be corrected upon review. Any transcriptional errors that result from this process are unintentional.      Disposition:  Follow up  Signed, Meldon Sport, MD  09/09/2023 2:28 PM     [facility]

## 2023-09-09 NOTE — Assessment & Plan Note (Signed)
Lab Results  Component Value Date   HGBA1C 6.2 (H) 01/25/2023   Advised to follow low-carb diet for now

## 2023-10-21 ENCOUNTER — Encounter: Payer: Self-pay | Admitting: Advanced Practice Midwife

## 2023-10-21 ENCOUNTER — Other Ambulatory Visit: Payer: Self-pay | Admitting: Adult Health

## 2023-10-21 DIAGNOSIS — N939 Abnormal uterine and vaginal bleeding, unspecified: Secondary | ICD-10-CM

## 2023-10-21 MED ORDER — MEGESTROL ACETATE 40 MG PO TABS
ORAL_TABLET | ORAL | 2 refills | Status: DC
Start: 2023-10-21 — End: 2023-10-26

## 2023-10-21 NOTE — Progress Notes (Signed)
 Refilled megace

## 2023-10-26 ENCOUNTER — Other Ambulatory Visit: Payer: Self-pay | Admitting: Adult Health

## 2023-10-26 MED ORDER — MEGESTROL ACETATE 40 MG/ML PO SUSP: ORAL | 0 refills | Status: AC

## 2023-10-26 NOTE — Progress Notes (Signed)
 Megace  suspension sent to [facility]

## 2024-02-02 ENCOUNTER — Ambulatory Visit
Admission: RE | Admit: 2024-02-02 | Discharge: 2024-02-02 | Disposition: A | Discharge status: Home / Self Care | Payer: Self-pay | Attending: Nurse Practitioner | Admitting: Nurse Practitioner

## 2024-02-02 VITALS — BP 144/77 | HR 94 | Temp 99.2°F | Resp 16

## 2024-02-02 DIAGNOSIS — J069 Acute upper respiratory infection, unspecified: Secondary | ICD-10-CM | POA: Diagnosis not present

## 2024-02-02 LAB — POCT INFLUENZA A/B
Influenza A, POC: NEGATIVE
Influenza B, POC: NEGATIVE

## 2024-02-02 LAB — POC SOFIA SARS ANTIGEN FIA: SARS Coronavirus 2 Ag: NEGATIVE

## 2024-02-02 MED ORDER — PROMETHAZINE-DM 6.25-15 MG/5ML PO SYRP: 5.0000 mL | ORAL_SOLUTION | Freq: Every evening | ORAL | 0 refills | Status: AC | PRN

## 2024-02-02 MED ORDER — BENZONATATE 100 MG PO CAPS: 100.0000 mg | ORAL_CAPSULE | Freq: Three times a day (TID) | ORAL | 0 refills | Status: AC | PRN

## 2024-02-02 NOTE — ED Provider Notes (Signed)
 [facility]    CSN: [id number] Arrival date & time: 02/02/24  0945      History   Chief Complaint Chief Complaint  Patient presents with   Sore Throat    I've been having a sore throat and cough for two days also with a lil running nose. - Entered by patient    HPI Tammy Perry is a 43 y.o. female.   Patient presents today with 2-day history of bodyaches, congested cough, chest pain when she coughs, runny and stuffy nose, sore throat, headache, intermittent abdominal pain, diarrhea, decreased appetite, and fatigue.  She denies known fever, shortness of breath or chest pain at rest, ear pain, nausea, vomiting.  Has taken TheraFlu and ibuprofen  for symptoms with minimal temporary improvement.  Reports her husband was sick approximately 1 month ago with similar symptoms.  Patient is confident she is not pregnant as she has a Nexplanon .    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 Authorizing Provider  benzonatate  (TESSALON ) 100 MG capsule Take 1 capsule (100 mg total) by mouth 3 (three) times daily as needed for cough. Do not take with alcohol or  while operating or driving heavy machinery 89/0/74  Yes Chandra Harlene LABOR, NP  promethazine -dextromethorphan (PROMETHAZINE -DM) 6.25-15 MG/5ML syrup Take 5 mLs by mouth at bedtime as needed for cough. Do not take with alcohol or while driving or operating heavy machinery.  May cause drowsiness. 02/02/24  Yes Chandra Harlene A, NP  clobetasol  cream (TEMOVATE ) 0.05 % APPLY TOPICALLY TO THE AFFECTED AREA TWICE DAILY 06/29/23   Tobie Suzzane POUR, MD  etonogestrel  (NEXPLANON ) 68 MG IMPL implant 1 each by Subdermal route once.    [provider]  fluticasone  (FLONASE ) 50 MCG/ACT nasal spray Place 2 sprays into both nostrils daily. 11/26/22   Leath-Warren, Etta PARAS, NP  megestrol  (MEGACE ) 40 MG/ML suspension Take 3 ml x 5 days then 2 ml x 5 days then 1 ml til bleeding stops 10/26/23   Signa Delon LABOR, NP  naproxen  (NAPROSYN ) 500 MG tablet Take 1 tablet (500 mg total) by mouth 2 (two) times daily with a meal. 09/09/23   Tobie Suzzane POUR, MD    Family History Family History  Problem Relation Age of Onset   Cancer Mother        lung   Hypertension Mother    Hyperlipidemia Mother  Asthma Son    Diabetes Maternal Uncle     Social History Social History   Tobacco Use   Smoking status: Never   Smokeless tobacco: Never  Vaping Use   Vaping status: Never Used  Substance Use Topics   Alcohol use: Yes    Comment: occ   Drug use: No     Allergies   Patient has no known allergies.   Review of Systems Review of Systems Per HPI  Physical Exam Triage Vital Signs ED Triage Vitals  Encounter Vitals Group     BP 02/02/24 1027 (!) 144/77     Girls Systolic BP Percentile --      Girls Diastolic BP Percentile --      Boys Systolic BP Percentile --      Boys Diastolic BP Percentile --      Pulse Rate 02/02/24 1027 94     Resp 02/02/24 1027 16     Temp 02/02/24 1027 99.2 F (37.3 C)     Temp Source 02/02/24 1027 Oral     SpO2 02/02/24 1027 95 %     Weight --      Height --       Head Circumference --      Peak Flow --      Pain Score 02/02/24 1029 5     Pain Loc --      Pain Education --      Exclude from Growth Chart --    No data found.  Updated Vital Signs BP (!) 144/77 (BP Location: Right Arm)   Pulse 94   Temp 99.2 F (37.3 C) (Oral)   Resp 16   SpO2 95%   Visual Acuity Right Eye Distance:   Left Eye Distance:   Bilateral Distance:    Right Eye Near:   Left Eye Near:    Bilateral Near:     Physical Exam Vitals and nursing note reviewed.  Constitutional:      General: She is not in acute distress.    Appearance: Normal appearance. She is ill-appearing. She is not toxic-appearing.  HENT:     Head: Normocephalic and atraumatic.     Right Ear: Tympanic membrane, ear canal and external ear normal. No drainage, swelling or tenderness. No middle ear effusion. Tympanic membrane is not erythematous.     Left Ear: Tympanic membrane, ear canal and external ear normal. No drainage, swelling or tenderness.  No middle ear effusion. Tympanic membrane is not erythematous.     Nose: Congestion and rhinorrhea present.     Mouth/Throat:     Mouth: Mucous membranes are moist.     Pharynx: Oropharynx is clear. Postnasal drip present. No oropharyngeal exudate or posterior oropharyngeal erythema.  Eyes:     General: No scleral icterus.    Extraocular Movements: Extraocular movements intact.  Cardiovascular:     Rate and Rhythm: Normal rate and regular rhythm.  Pulmonary:     Effort: Pulmonary effort is normal. No respiratory distress.     Breath sounds: Normal breath sounds. No wheezing, rhonchi or rales.  Musculoskeletal:     Cervical back: Normal range of motion and neck supple.  Lymphadenopathy:     Cervical: No cervical adenopathy.  Skin:    General: Skin is warm and dry.     Coloration: Skin is not jaundiced or pale.     Findings: No erythema or rash.  Neurological:     Mental Status: She is alert and oriented to person, place,  and time.   Psychiatric:        Behavior: Behavior is cooperative.      UC Treatments / Results  Labs (all labs ordered are listed, but only abnormal results are displayed) Labs Reviewed  POCT INFLUENZA A/B - Normal  POC SOFIA SARS ANTIGEN FIA    EKG   Radiology No results found.  Procedures Procedures (including critical care time)  Medications Ordered in UC Medications - No data to display  Initial Impression / Assessment and Plan / UC Course  I have reviewed the triage vital signs and the nursing notes.  Pertinent labs & imaging results that were available during my care of the patient were reviewed by me and considered in my medical decision making (see chart for details).   Patient is well-appearing, normotensive, afebrile, not tachycardic, not tachypneic, oxygenating well on room air.    1. Viral URI with cough Vitals and exam today are stable COVID-19, influenza testing is negative Supportive care discussed with patient, start cough suppressant medication ER and return precautions discussed  The patient was given the opportunity to ask questions.  All questions answered to their satisfaction.  The patient is in agreement to this plan.   Final Clinical Impressions(s) / UC Diagnoses   Final diagnoses:  Viral URI with cough     Discharge Instructions      You have a viral upper respiratory infection.  Symptoms should improve over the next week to 10 days.  If you develop chest pain or shortness of breath, go to the emergency room.  COVID-19 test is negative today.  Flu test is pending - I will message you with results later today.  Some things that can make you feel better are: - Increased rest - Increasing fluid with water/sugar free electrolytes - Acetaminophen  and ibuprofen  as needed for fever/pain - Salt water gargling, chloraseptic spray and throat lozenges - OTC guaifenesin (Mucinex) 600 mg twice daily - Saline sinus flushes or a neti pot - Humidifying the  air -Tessalon  Perles every 8 hours as needed for dry cough and cough syrup at night time as needed     ED Prescriptions     Medication Sig Dispense Auth. Provider   benzonatate  (TESSALON ) 100 MG capsule Take 1 capsule (100 mg total) by mouth 3 (three) times daily as needed for cough. Do not take with alcohol or while operating or driving heavy machinery 21 capsule Chandra Raisin A, NP   promethazine -dextromethorphan (PROMETHAZINE -DM) 6.25-15 MG/5ML syrup Take 5 mLs by mouth at bedtime as needed for cough. Do not take with alcohol or while driving or operating heavy machinery.  May cause drowsiness. 118 mL Chandra Raisin LABOR, NP      PDMP not reviewed this encounter.   Chandra Raisin LABOR, NP 02/02/24 1147

## 2024-02-02 NOTE — ED Triage Notes (Signed)
 Pt reports sore throat, cough, congestion, headache onset Tuesday. Denies fever

## 2024-02-02 NOTE — Discharge Instructions (Signed)
 You have a viral upper respiratory infection.  Symptoms should improve over the next week to 10 days.  If you develop chest pain or shortness of breath, go to the emergency room.  COVID-19 test is negative today.  Flu test is pending - I will message you with results later today.  Some things that can make you feel better are: - Increased rest - Increasing fluid with water/sugar free electrolytes - Acetaminophen  and ibuprofen  as needed for fever/pain - Salt water gargling, chloraseptic spray and throat lozenges - OTC guaifenesin (Mucinex) 600 mg twice daily - Saline sinus flushes or a neti pot - Humidifying the air -Tessalon  Perles every 8 hours as needed for dry cough and cough syrup at night time as needed

## 2024-03-07 ENCOUNTER — Other Ambulatory Visit: Payer: Self-pay | Admitting: Internal Medicine

## 2024-03-07 DIAGNOSIS — L280 Lichen simplex chronicus: Secondary | ICD-10-CM

## 2024-04-17 ENCOUNTER — Emergency Department (HOSPITAL_COMMUNITY)
Admission: EM | Admit: 2024-04-17 | Discharge: 2024-04-18 | Disposition: A | Discharge status: Home / Self Care | Attending: Emergency Medicine | Admitting: Emergency Medicine

## 2024-04-17 ENCOUNTER — Other Ambulatory Visit: Payer: Self-pay

## 2024-04-17 ENCOUNTER — Encounter (HOSPITAL_COMMUNITY): Payer: Self-pay | Admitting: Emergency Medicine

## 2024-04-17 DIAGNOSIS — Z79899 Other long term (current) drug therapy: Secondary | ICD-10-CM | POA: Insufficient documentation

## 2024-04-17 DIAGNOSIS — K0889 Other specified disorders of teeth and supporting structures: Secondary | ICD-10-CM | POA: Diagnosis not present

## 2024-04-17 MED ORDER — AMOXICILLIN-POT CLAVULANATE 875-125 MG PO TABS: 1.0000 | ORAL_TABLET | Freq: Two times a day (BID) | ORAL | 0 refills | Status: DC

## 2024-04-17 MED ORDER — AMOXICILLIN-POT CLAVULANATE 875-125 MG PO TABS
1.0000 | ORAL_TABLET | Freq: Once | ORAL | Status: AC
  Administered 2024-04-18: 1 via ORAL
  Filled 2024-04-17: qty 1

## 2024-04-17 MED ORDER — OXYCODONE-ACETAMINOPHEN 5-325 MG PO TABS
1.0000 | ORAL_TABLET | Freq: Once | ORAL | Status: AC
  Administered 2024-04-18: 1 via ORAL
  Filled 2024-04-17: qty 1

## 2024-04-17 MED ORDER — KETOROLAC TROMETHAMINE 60 MG/2ML IM SOLN
30.0000 mg | Freq: Once | INTRAMUSCULAR | Status: AC
  Administered 2024-04-18: 30 mg via INTRAMUSCULAR
  Filled 2024-04-17: qty 2

## 2024-04-17 NOTE — ED Notes (Signed)
 ED Provider at bedside.

## 2024-04-17 NOTE — ED Triage Notes (Signed)
Pt c/o right upper dental pain x 3 days.

## 2024-04-17 NOTE — Discharge Instructions (Signed)
 A prescription for antibiotics was sent to your pharmacy.  Take as prescribed for the next 7 days.  Take ibuprofen  and Tylenol  as needed for pain.  Ensure that you follow-up with your dentist as soon as possible for further management.

## 2024-04-17 NOTE — ED Provider Notes (Signed)
 " [facility] Provider Note   CSN: [id number] Arrival date & time: 04/17/24  2140     Patient presents with: Dental Pain   Tammy Perry is a 43 y.o. female.    Dental Pain Patient presents for dental pain.  Medical history includes prediabetes, HLD, eczema.  3 days ago, she had onset of right upper molar pain.  Last dose of ibuprofen  was 5 PM.  She has not had any systemic symptoms.  She tried to get in to see her dentist but will not be able to until Monday.     Prior to Admission medications  Medication Sig Start Date End Date Taking? Authorizing Provider  amoxicillin -clavulanate (AUGMENTIN ) 875-125 MG tablet Take 1 tablet by mouth every 12 (twelve) hours. 04/17/24  Yes Melvenia Motto, MD  benzonatate  (TESSALON ) 100 MG capsule Take 1 capsule (100 mg total) by mouth 3 (three) times daily as needed for cough. Do not take with alcohol or while operating or driving heavy machinery 89/0/74   Chandra Harlene LABOR, NP  clobetasol  cream (TEMOVATE ) 0.05 % APPLY TOPICALLY TO THE AFFECTED AREA TWICE DAILY 03/07/24   Tobie Suzzane POUR, MD  etonogestrel  (NEXPLANON ) 68 MG IMPL implant 1 each by Subdermal route once.    [provider]  fluticasone  (FLONASE ) 50 MCG/ACT nasal spray Place 2 sprays into both nostrils daily. 11/26/22   Leath-Warren, Etta PARAS, NP  megestrol  (MEGACE ) 40 MG/ML suspension Take 3 ml x 5 days then 2 ml x 5 days then 1 ml til bleeding stops 10/26/23   Signa Delon LABOR, NP  naproxen  (NAPROSYN ) 500 MG tablet Take 1 tablet (500 mg total) by mouth 2 (two) times daily with a meal. 09/09/23   Tobie Suzzane POUR, MD  promethazine -dextromethorphan (PROMETHAZINE -DM) 6.25-15 MG/5ML syrup Take 5 mLs by mouth at bedtime as needed for cough. Do not take with alcohol or while driving or operating heavy machinery.  May cause drowsiness. 02/02/24   Chandra Harlene LABOR, NP    Allergies: Patient has no known allergies.    Review of Systems   HENT:  Positive for dental problem.   All other systems reviewed and are negative.   Updated Vital Signs BP (!) 148/88 (BP Location: Right Arm)   Pulse 88   Temp 97.8 F (36.6 C) (Tympanic)   Resp 18   Ht 5' 6 (1.676 m)   Wt 99.8 kg   SpO2 100%   BMI 35.51 kg/m   Physical Exam Vitals and nursing note reviewed.  Constitutional:      General: She is not in acute distress.    Appearance: Normal appearance. She is well-developed. She is not ill-appearing, toxic-appearing or diaphoretic.  HENT:     Head: Normocephalic and atraumatic.     Right Ear: External ear normal.     Left Ear: External ear normal.     Nose: Nose normal.     Mouth/Throat:     Mouth: Mucous membranes are moist.     Pharynx: Oropharynx is clear.     Comments: Filling present on tooth #3.  No obvious new areas of decay.  No areas of gingival erythema, swelling, fluctuance, or induration. Eyes:     Extraocular Movements: Extraocular movements intact.     Conjunctiva/sclera: Conjunctivae normal.  Cardiovascular:     Rate and Rhythm: Normal rate and regular rhythm.  Pulmonary:     Effort: Pulmonary effort is normal. No respiratory distress.  Abdominal:     General:  There is no distension.  Musculoskeletal:        General: No swelling. Normal range of motion.     Cervical back: Neck supple.  Skin:    General: Skin is warm and dry.     Coloration: Skin is not jaundiced or pale.  Neurological:     General: No focal deficit present.     Mental Status: She is alert and oriented to person, place, and time.  Psychiatric:        Mood and Affect: Mood normal.        Behavior: Behavior normal.     (all labs ordered are listed, but only abnormal results are displayed) Labs Reviewed - No data to display  EKG: None  Radiology: No results found.   Procedures   Medications Ordered in the ED  amoxicillin -clavulanate (AUGMENTIN ) 875-125 MG per tablet 1 tablet (has no administration in time range)   ketorolac  (TORADOL ) injection 30 mg (has no administration in time range)  oxyCODONE -acetaminophen  (PERCOCET/ROXICET) 5-325 MG per tablet 1 tablet (has no administration in time range)                                    Medical Decision Making Risk Prescription drug management.   Patient presenting for 3 days of pain in right upper molar.  Vital signs on arrival are normal.  Patient is well-appearing on exam.  On direct inspection of oral cavity, there are no areas of swelling, erythema, induration, or fluctuance.  Patient was ordered multimodal pain control in the ED.  First dose of Augmentin  was provided.  Patient does state that she will be able to follow-up with her dentist on Monday.  She was given prescription for Augmentin  and discharged in stable condition.     Final diagnoses:  Pain, dental    ED Discharge Orders          Ordered    amoxicillin -clavulanate (AUGMENTIN ) 875-125 MG tablet  Every 12 hours        04/17/24 2351               Melvenia Motto, MD 04/17/24 2354  "

## 2024-04-24 ENCOUNTER — Emergency Department (HOSPITAL_COMMUNITY)
Admission: EM | Admit: 2024-04-24 | Discharge: 2024-04-24 | Disposition: A | Discharge status: Home / Self Care | Attending: Emergency Medicine | Admitting: Emergency Medicine

## 2024-04-24 ENCOUNTER — Other Ambulatory Visit: Payer: Self-pay

## 2024-04-24 ENCOUNTER — Encounter (HOSPITAL_COMMUNITY): Payer: Self-pay

## 2024-04-24 DIAGNOSIS — R110 Nausea: Secondary | ICD-10-CM | POA: Insufficient documentation

## 2024-04-24 DIAGNOSIS — K029 Dental caries, unspecified: Secondary | ICD-10-CM | POA: Diagnosis not present

## 2024-04-24 MED ORDER — CLINDAMYCIN HCL 300 MG PO CAPS: 300.0000 mg | ORAL_CAPSULE | Freq: Three times a day (TID) | ORAL | 0 refills | Status: AC

## 2024-04-24 MED ORDER — CLINDAMYCIN HCL 150 MG PO CAPS
300.0000 mg | ORAL_CAPSULE | Freq: Once | ORAL | Status: DC
  Filled 2024-04-24: qty 2

## 2024-04-24 NOTE — ED Provider Notes (Signed)
 " [facility] Provider Note   CSN: [id number] Arrival date & time: 04/24/24  1518     Patient presents with: Medication Reaction   Tammy Perry is a 43 y.o. female.   HPI 43 year old female presenting with nausea with antibiotic.  Patient reports that she was seen here on 04/17/2024 for dental pain.  At that time she was put on a Augmentin  until she could see her dentist.  She reports that she has not been able to get an appointment with her dentist yet.  She has been taking the Augmentin  however it makes her feel very nauseous all day when she takes it.  He is coming in now to see if there is a different antibiotic that she could have.  Denies any shortness of breath or chest pain.  She also denies any rashes or other allergic reaction symptoms.    Prior to Admission medications  Medication Sig Start Date End Date Taking? Authorizing Provider  amoxicillin -clavulanate (AUGMENTIN ) 875-125 MG tablet Take 1 tablet by mouth every 12 (twelve) hours. 04/17/24   Melvenia Motto, MD  benzonatate  (TESSALON ) 100 MG capsule Take 1 capsule (100 mg total) by mouth 3 (three) times daily as needed for cough. Do not take with alcohol or while operating or driving heavy machinery 89/0/74   Chandra Harlene LABOR, NP  clobetasol  cream (TEMOVATE ) 0.05 % APPLY TOPICALLY TO THE AFFECTED AREA TWICE DAILY 03/07/24   Tobie Suzzane POUR, MD  etonogestrel  (NEXPLANON ) 68 MG IMPL implant 1 each by Subdermal route once.    [provider]  fluticasone  (FLONASE ) 50 MCG/ACT nasal spray Place 2 sprays into both nostrils daily. 11/26/22   Leath-Warren, Etta PARAS, NP  megestrol  (MEGACE ) 40 MG/ML suspension Take 3 ml x 5 days then 2 ml x 5 days then 1 ml til bleeding stops 10/26/23   Signa Delon LABOR, NP  naproxen  (NAPROSYN ) 500 MG tablet Take 1 tablet (500 mg total) by mouth 2 (two) times daily with a meal. 09/09/23   Tobie Suzzane POUR, MD  promethazine -dextromethorphan  (PROMETHAZINE -DM) 6.25-15 MG/5ML syrup Take 5 mLs by mouth at bedtime as needed for cough. Do not take with alcohol or while driving or operating heavy machinery.  May cause drowsiness. 02/02/24   Chandra Harlene LABOR, NP    Allergies: Patient has no known allergies.    Review of Systems  All other systems reviewed and are negative.   Updated Vital Signs BP (!) 148/88 (BP Location: Right Arm)   Pulse 75   Temp 98.9 F (37.2 C) (Oral)   Resp 18   Wt 99.8 kg   SpO2 100%   BMI 35.51 kg/m   Physical Exam Vitals and nursing note reviewed.  HENT:     Mouth/Throat:     Mouth: Mucous membranes are moist.     Dentition: Does not have dentures. Dental tenderness and dental caries present. No gingival swelling, dental abscesses or gum lesions.     Pharynx: Oropharynx is clear.     Tonsils: No tonsillar exudate or tonsillar abscesses.      Comments: No elevation to the tongue noted.  No gingival swelling.  Uvula is midline. Eyes:     General: No scleral icterus. Pulmonary:     Effort: Pulmonary effort is normal.  Skin:    Coloration: Skin is not jaundiced.     Findings: No rash.  Neurological:     General: No focal deficit present.     Mental Status:  She is alert.     (all labs ordered are listed, but only abnormal results are displayed) Labs Reviewed - No data to display  EKG: None  Radiology: No results found.   Procedures   Medications Ordered in the ED - No data to display                                  Medical Decision Making Risk Prescription drug management.   Impression: 43 year old female presenting with dental pain.  Torrential diagnoses include dental caries, dental abscess, Ludwig's angina  Additional History: Patient was able to provide history.  I also reviewed other outpatient notes and her previous ER visit.  Labs: None  Imaging: None  ED Course/Meds: Patient was last seen on 04/17/2024 for a dental infection.  She was given amoxicillin  at  that time and told to follow-up with her dentist.  Since then she has been taking the Augmentin  however it gives her nausea.  She comes today to see if she can get a different antibiotic.  I sent clindamycin into her pharmacy told her to stop taking the Augmentin  and start clindamycin.  I also stressed the importance of her following up with her dentist.  She was educated on the side effects of clindamycin including but not limited to a C. difficile infection.  She verbally stated that she understood this.  Patient remained stable while in the ER and at discharge.      Final diagnoses:  None    ED Discharge Orders     None          Rosaline Almarie MATSU, [place] 04/24/24 1652  "

## 2024-04-24 NOTE — Discharge Instructions (Signed)
 Stop taking your Augmentin  and begin taking clindamycin.  Please schedule a dentist appointment as soon as you can.  Let them know that you were seen in the ER for this.  If you start to develop any shortness of breath, chest pain, difficulty swallowing, difficulty breathing please return to the ER.

## 2024-04-24 NOTE — ED Triage Notes (Signed)
 Pt reports she was seen here and given amoxicillin  for tooth pain.  Pt reports the medicine is making her nauseous so she would like it changed.  Reports she can not see the dentist until next week.
